# Patient Record
Sex: Female | Born: 1995 | Race: White | Hispanic: No | Marital: Single | Smoking: Never smoker
Health system: Southern US, Community
[De-identification: ages and names within clinical notes are randomized; demographics above are authoritative.]

## PROBLEM LIST (undated history)

## (undated) ENCOUNTER — Inpatient Hospital Stay (HOSPITAL_COMMUNITY): Payer: Self-pay

## (undated) ENCOUNTER — Emergency Department (HOSPITAL_COMMUNITY): Admission: EM | Payer: Self-pay

## (undated) DIAGNOSIS — S83006A Unspecified dislocation of unspecified patella, initial encounter: Secondary | ICD-10-CM

## (undated) DIAGNOSIS — K589 Irritable bowel syndrome without diarrhea: Secondary | ICD-10-CM

## (undated) DIAGNOSIS — D649 Anemia, unspecified: Secondary | ICD-10-CM

## (undated) DIAGNOSIS — E039 Hypothyroidism, unspecified: Secondary | ICD-10-CM

## (undated) DIAGNOSIS — J45909 Unspecified asthma, uncomplicated: Secondary | ICD-10-CM

## (undated) HISTORY — PX: KNEE SURGERY: SHX244

## (undated) HISTORY — PX: TONSILLECTOMY: SUR1361

---
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
## 2015-01-14 ENCOUNTER — Emergency Department (HOSPITAL_BASED_OUTPATIENT_CLINIC_OR_DEPARTMENT_OTHER)
Admission: EM | Admit: 2015-01-14 | Discharge: 2015-01-14 | Disposition: A | Discharge status: Home / Self Care | Payer: Medicaid Other | Attending: Emergency Medicine | Admitting: Emergency Medicine

## 2015-01-14 ENCOUNTER — Encounter (HOSPITAL_BASED_OUTPATIENT_CLINIC_OR_DEPARTMENT_OTHER): Payer: Self-pay

## 2015-01-14 ENCOUNTER — Emergency Department (HOSPITAL_BASED_OUTPATIENT_CLINIC_OR_DEPARTMENT_OTHER): Payer: Medicaid Other

## 2015-01-14 DIAGNOSIS — M940 Chondrocostal junction syndrome [Tietze]: Secondary | ICD-10-CM | POA: Insufficient documentation

## 2015-01-14 DIAGNOSIS — Z79899 Other long term (current) drug therapy: Secondary | ICD-10-CM | POA: Diagnosis not present

## 2015-01-14 DIAGNOSIS — Z8639 Personal history of other endocrine, nutritional and metabolic disease: Secondary | ICD-10-CM | POA: Insufficient documentation

## 2015-01-14 DIAGNOSIS — Z8719 Personal history of other diseases of the digestive system: Secondary | ICD-10-CM | POA: Insufficient documentation

## 2015-01-14 DIAGNOSIS — R079 Chest pain, unspecified: Secondary | ICD-10-CM | POA: Diagnosis present

## 2015-01-14 DIAGNOSIS — J45909 Unspecified asthma, uncomplicated: Secondary | ICD-10-CM | POA: Insufficient documentation

## 2015-01-14 DIAGNOSIS — R0789 Other chest pain: Secondary | ICD-10-CM | POA: Insufficient documentation

## 2015-01-14 DIAGNOSIS — Z862 Personal history of diseases of the blood and blood-forming organs and certain disorders involving the immune mechanism: Secondary | ICD-10-CM | POA: Insufficient documentation

## 2015-01-14 DIAGNOSIS — Z7951 Long term (current) use of inhaled steroids: Secondary | ICD-10-CM | POA: Insufficient documentation

## 2015-01-14 HISTORY — DX: Hypothyroidism, unspecified: E03.9

## 2015-01-14 MED ORDER — NAPROXEN 500 MG PO TABS: 500.0000 mg | ORAL_TABLET | Freq: Two times a day (BID) | ORAL | Status: DC

## 2015-01-14 MED ORDER — KETOROLAC TROMETHAMINE 60 MG/2ML IM SOLN
60.0000 mg | Freq: Once | INTRAMUSCULAR | Status: AC
  Administered 2015-01-14: 60 mg via INTRAMUSCULAR
  Filled 2015-01-14: qty 2

## 2015-01-14 NOTE — ED Notes (Signed)
C/o CP, left arm pain started approx 30 min PTA

## 2015-01-14 NOTE — Discharge Instructions (Signed)
Take naproxen as prescribed as needed for pain.  Chest Wall Pain Chest wall pain is pain in or around the bones and muscles of your chest. It may take up to 6 weeks to get better. It may take longer if you must stay physically active in your work and activities.  CAUSES  Chest wall pain may happen on its own. However, it may be caused by:  A viral illness like the flu.  Injury.  Coughing.  Exercise.  Arthritis.  Fibromyalgia.  Shingles. HOME CARE INSTRUCTIONS   Avoid overtiring physical activity. Try not to strain or perform activities that cause pain. This includes any activities using your chest or your abdominal and side muscles, especially if heavy weights are used.  Put ice on the sore area.  Put ice in a plastic bag.  Place a towel between your skin and the bag.  Leave the ice on for 15-20 minutes per hour while awake for the first 2 days.  Only take over-the-counter or prescription medicines for pain, discomfort, or fever as directed by your caregiver. SEEK IMMEDIATE MEDICAL CARE IF:   Your pain increases, or you are very uncomfortable.  You have a fever.  Your chest pain becomes worse.  You have new, unexplained symptoms.  You have nausea or vomiting.  You feel sweaty or lightheaded.  You have a cough with phlegm (sputum), or you cough up blood. MAKE SURE YOU:   Understand these instructions.  Will watch your condition.  Will get help right away if you are not doing well or get worse. Document Released: 07/06/2005 Document Revised: 09/28/2011 Document Reviewed: 03/02/2011 [facility] Patient Information 2015 [place], [place]. This information is not intended to replace advice given to you by your health care provider. Make sure you discuss any questions you have with your health care provider.  Costochondritis Costochondritis, sometimes called Tietze syndrome, is a swelling and irritation (inflammation) of the tissue (cartilage) that connects your ribs  with your breastbone (sternum). It causes pain in the chest and rib area. Costochondritis usually goes away on its own over time. It can take up to 6 weeks or longer to get better, especially if you are unable to limit your activities. CAUSES  Some cases of costochondritis have no known cause. Possible causes include:  Injury (trauma).  Exercise or activity such as lifting.  Severe coughing. SIGNS AND SYMPTOMS  Pain and tenderness in the chest and rib area.  Pain that gets worse when coughing or taking deep breaths.  Pain that gets worse with specific movements. DIAGNOSIS  Your health care provider will do a physical exam and ask about your symptoms. Chest X-rays or other tests may be done to rule out other problems. TREATMENT  Costochondritis usually goes away on its own over time. Your health care provider may prescribe medicine to help relieve pain. HOME CARE INSTRUCTIONS   Avoid exhausting physical activity. Try not to strain your ribs during normal activity. This would include any activities using chest, abdominal, and side muscles, especially if heavy weights are used.  Apply ice to the affected area for the first 2 days after the pain begins.  Put ice in a plastic bag.  Place a towel between your skin and the bag.  Leave the ice on for 20 minutes, 2-3 times a day.  Only take over-the-counter or prescription medicines as directed by your health care provider. SEEK MEDICAL CARE IF:  You have redness or swelling at the rib joints. These are signs of infection.  Your pain does not go away despite rest or medicine. SEEK IMMEDIATE MEDICAL CARE IF:   Your pain increases or you are very uncomfortable.  You have shortness of breath or difficulty breathing.  You cough up blood.  You have worse chest pains, sweating, or vomiting.  You have a fever or persistent symptoms for more than 2-3 days.  You have a fever and your symptoms suddenly get worse. MAKE SURE YOU:    Understand these instructions.  Will watch your condition.  Will get help right away if you are not doing well or get worse. Document Released: 04/15/2005 Document Revised: 04/26/2013 Document Reviewed: 02/07/2013 [facility] Patient Information 2015 [place], [place]. This information is not intended to replace advice given to you by your health care provider. Make sure you discuss any questions you have with your health care provider.

## 2015-01-14 NOTE — ED Provider Notes (Signed)
CSN: [id number]     Arrival date & time 01/14/15  2115 History   First MD Initiated Contact with Patient 01/14/15 2128     Chief Complaint  Patient presents with  . Chest Pain     (Consider location/radiation/quality/duration/timing/severity/associated sxs/prior Treatment) HPI Comments: 19 year old female presenting with sudden onset midsternal chest pain beginning 30 minutes prior to arrival while in the car. States she was feeling fine prior to the onset of pain. Pain is described as a constant, dull ache that is also sharp, slightly radiating into the left side of her chest, worse with certain movements. Endorses pain down her left arm, however states it is not radiating from her chest. Denies numbness or tingling. Denies shortness of breath, cough, fever, chills, nausea, vomiting or diaphoresis. Denies ever having pain like this in the past. No injury or trauma. No family history of sudden cardiac death. Positive family history of early heart disease in a paternal grandfather. No personal or family history of blood clots. She is on a birth control patch but unsure of the name. Recently traveled in a car for a few hours to the beach, however no other recent immobilizations. No calf pain or swelling. Denies being under any stress/anxiety.  Patient is a 19 y.o. female presenting with chest pain. The history is provided by the patient and a parent.  Chest Pain Chest pain location: mid-sternal/left. Pain quality: dull and sharp   Pain radiates to the back: no   Pain severity:  Moderate Onset quality:  Sudden Duration:  30 minutes Timing:  Constant Progression:  Unchanged Chronicity:  New Context: at rest   Relieved by:  None tried Worsened by:  Nothing tried Ineffective treatments:  None tried Risk factors: birth control and obesity     Past Medical History  Diagnosis Date  . Asthma   . Anemia   . IBS (irritable bowel syndrome)   . Hypothyroid    Past Surgical History  Procedure  Laterality Date  . Knee surgery    . Video bronchoscopy Bilateral 06/29/2013    Procedure: VIDEO BRONCHOSCOPY WITHOUT FLUORO;  Surgeon: Brand Males, MD;  Location: [facility];  Service: Cardiopulmonary;  Laterality: Bilateral;  . Tonsillectomy     Family History  Problem Relation Age of Onset  . Asthma Paternal Grandfather   . Asthma Paternal Grandmother   . Heart disease Maternal Grandfather   . Heart disease Paternal Grandfather   . Heart disease Paternal Uncle   . Clotting disorder Paternal Grandmother   . Rheum arthritis Paternal Grandmother   . Cancer Paternal Grandfather     and several uncles.    History  Substance Use Topics  . Smoking status: Never Smoker   . Smokeless tobacco: Not on file  . Alcohol Use: No   OB History    No data available     Review of Systems  Cardiovascular: Positive for chest pain.  All other systems reviewed and are negative.     Allergies  Azithromycin; Augmentin; Codeine; and Sulfa antibiotics  Home Medications   Prior to Admission medications   Medication Sig Start Date End Date Taking? Authorizing Provider  AMITRIPTYLINE HCL PO Take by mouth.   Yes Historical Provider, MD  albuterol (PROAIR HFA) 108 (90 BASE) MCG/ACT inhaler Inhale 2 puffs into the lungs every 4 (four) hours as needed for wheezing or shortness of breath. 11/19/14   Midge Minium, MD  beclomethasone (QVAR) 40 MCG/ACT inhaler Inhale 1 puff into the lungs 2 (two)  times daily. 11/19/14   Midge Minium, MD  montelukast (SINGULAIR) 10 MG tablet TAKE ONE TABLET BY MOUTH AT BEDTIME 10/08/14   Midge Minium, MD  naproxen (NAPROSYN) 500 MG tablet Take 1 tablet (500 mg total) by mouth 2 (two) times daily. 01/14/15   Ozzy Bohlken M Analise Glotfelty, PA-C   BP 113/67 mmHg  Pulse 79  Temp(Src) 98.1 F (36.7 C) (Oral)  Resp 12  Ht 5\' 4"  (1.626 m)  Wt 242 lb (109.77 kg)  BMI 41.52 kg/m2  SpO2 100%  LMP 01/07/2015 Physical Exam  Constitutional: She is oriented to person,  place, and time. She appears well-developed and well-nourished. No distress.  HENT:  Head: Normocephalic and atraumatic.  Mouth/Throat: Oropharynx is clear and moist.  Eyes: Conjunctivae and EOM are normal. Pupils are equal, round, and reactive to light.  Neck: Normal range of motion. Neck supple. No JVD present.  Cardiovascular: Normal rate, regular rhythm, normal heart sounds and intact distal pulses.   No extremity edema.  Pulmonary/Chest: Effort normal and breath sounds normal. No respiratory distress. She exhibits tenderness.    Abdominal: Soft. Bowel sounds are normal. There is no tenderness.  Musculoskeletal: Normal range of motion. She exhibits no edema.  Neurological: She is alert and oriented to person, place, and time. She has normal strength. No sensory deficit.  Speech fluent, goal oriented. Moves extremities without ataxia. Equal grip strength bilateral.  Skin: Skin is warm and dry. She is not diaphoretic.  Psychiatric: She has a normal mood and affect. Her behavior is normal.  Nursing note and vitals reviewed.   ED Course  Procedures (including critical care time) Labs Review Labs Reviewed - No data to display  Imaging Review Dg Chest 2 View  01/14/2015   CLINICAL DATA:  Left-sided chest pain and difficulty breathing.  EXAM: CHEST - 2 VIEW  COMPARISON:  06/09/2013  FINDINGS: The heart size and mediastinal contours are within normal limits. There is no evidence of pulmonary edema, consolidation, pneumothorax, nodule or pleural fluid. The visualized skeletal structures are unremarkable.  IMPRESSION: No active disease.   Electronically Signed   By: Aletta Edouard M.D.   On: 01/14/2015 22:02     EKG Interpretation   Date/Time:  Monday January 14 2015 21:32:41 EDT Ventricular Rate:  85 PR Interval:  176 QRS Duration: 92 QT Interval:  368 QTC Calculation: 437 R Axis:   74 Text Interpretation:  Normal sinus rhythm Normal ECG No previous ECGs  available Confirmed by  Christy Gentles  MD, DONALD ([id number]) on 01/14/2015 9:35:10  PM      MDM   Final diagnoses:  Chest wall pain  Costochondritis   Non-toxic appearing, NAD. Afebrile. VSS. Alert and appropriate for age.  CP is reproducible. Doubt cardiac. Low risk. Doubt PE, low suspicion. CXR negative. CP improved with toradol. Normal EKG. I do not feel further workup necessary at this time. Pt stable for d/c. Return precautions given. Pt and parent state understanding of plan and are agreeable.  Carman Ching, PA-C 01/14/15 2245  Carman Ching, PA-C 01/14/15 7026  Ripley Fraise, MD 01/14/15 [phone]

## 2015-01-15 ENCOUNTER — Other Ambulatory Visit: Payer: Self-pay | Admitting: Family Medicine

## 2015-01-16 ENCOUNTER — Encounter: Payer: Self-pay | Admitting: Family Medicine

## 2015-01-16 ENCOUNTER — Telehealth: Payer: Self-pay | Admitting: *Deleted

## 2015-01-16 ENCOUNTER — Ambulatory Visit (INDEPENDENT_AMBULATORY_CARE_PROVIDER_SITE_OTHER): Payer: Self-pay | Admitting: Family Medicine

## 2015-01-16 VITALS — BP 122/82 | HR 94 | Temp 98.0°F | Resp 16 | Wt 242.0 lb

## 2015-01-16 DIAGNOSIS — E039 Hypothyroidism, unspecified: Secondary | ICD-10-CM

## 2015-01-16 LAB — BASIC METABOLIC PANEL
BUN: 11 mg/dL (ref 6–23)
CHLORIDE: 108 meq/L (ref 96–112)
CO2: 26 meq/L (ref 19–32)
CREATININE: 0.68 mg/dL (ref 0.40–1.20)
Calcium: 9.2 mg/dL (ref 8.4–10.5)
GFR: 118.92 mL/min (ref >60.00)
Glucose, Bld: 87 mg/dL (ref 70–99)
Potassium: 4.4 mEq/L (ref 3.5–5.1)
Sodium: 143 mEq/L (ref 135–145)

## 2015-01-16 LAB — HEMOGLOBIN A1C: HEMOGLOBIN A1C: 5.6 % (ref 4.6–6.5)

## 2015-01-16 LAB — TSH: TSH: 2.96 u[IU]/mL (ref 0.40–5.00)

## 2015-01-16 NOTE — Telephone Encounter (Signed)
Patient scheduled for new patient appointment with Dr. Birdie Riddle today.  Patient was recently seen in ED 01/14/15 for chest wall pain/ costochondritis.  Attempted to call patient to follow-up and triage.  Patient was unavailable at time of call and voicemail was full.

## 2015-01-16 NOTE — Patient Instructions (Signed)
Once we have your lab results we'll determine when you need to follow up Try and make healthy food choices and get regular exercise Call with any questions or concerns Hang in there!!!

## 2015-01-16 NOTE — Progress Notes (Signed)
   Subjective:    Patient ID: Jessica Caldwell, female    DOB: 11/30/1995, 19 y.o.   MRN: [id number]  HPI Hypothyroid- pt was living w/ her father and seeing another doctor.  Was previously on thyroid med and was told 'it's working so we'll take her off it'.  Now having fatigue, weight gain.  Has been off meds since last year.  Pt reports feeling tired 'the majority of the time'.  Denies constipation, dry skin/hair/nails.  Pt's weight has gone from 183 lbs 14 months ago to 262.  Denies increased urination.  + thirst.   Review of Systems For ROS see HPI     Objective:   Physical Exam  Constitutional: She is oriented to person, place, and time. She appears well-developed and well-nourished. No distress.  obese  HENT:  Head: Normocephalic and atraumatic.  Eyes: Conjunctivae and EOM are normal. Pupils are equal, round, and reactive to light.  Neck: Normal range of motion. Neck supple. Thyromegaly (w/o nodularity) present.  Cardiovascular: Normal rate, regular rhythm, normal heart sounds and intact distal pulses.   No murmur heard. Pulmonary/Chest: Effort normal and breath sounds normal. No respiratory distress.  Abdominal: Soft. She exhibits no distension. There is no tenderness.  Musculoskeletal: She exhibits no edema.  Lymphadenopathy:    She has no cervical adenopathy.  Neurological: She is alert and oriented to person, place, and time.  Skin: Skin is warm and dry.  Psychiatric: She has a normal mood and affect. Her behavior is normal.  Vitals reviewed.         Assessment & Plan:

## 2015-01-16 NOTE — Telephone Encounter (Signed)
Med filled.  

## 2015-01-16 NOTE — Assessment & Plan Note (Addendum)
Chronic problem but pt has been off all meds since last year.  Pt has had weight gain and fatigue.  + thyromegaly but pt doesn't currently have insurance.  This will need [place] evaluation but mom is in process of transferring pt's Medicaid.  Check labs and restart thyroid meds at appropriate dose.

## 2015-01-16 NOTE — Assessment & Plan Note (Signed)
Deteriorated.  Pt has had 80 lb weight gain in last year.  Pt reports she was up as high as 280 but recently dropped 18 lbs rapidly and w/o changes to diet or exercise.  This, coupled w/ her increased thirst and fatigue, is concerning for diabetes.  Check labs.  Will follow closely.

## 2015-01-17 ENCOUNTER — Other Ambulatory Visit: Payer: Self-pay | Admitting: General Practice

## 2015-01-17 MED ORDER — LEVOTHYROXINE SODIUM 50 MCG PO TABS: 50.0000 ug | ORAL_TABLET | Freq: Every day | ORAL | Status: DC

## 2015-02-10 ENCOUNTER — Encounter (HOSPITAL_BASED_OUTPATIENT_CLINIC_OR_DEPARTMENT_OTHER): Payer: Self-pay | Admitting: Emergency Medicine

## 2015-02-10 ENCOUNTER — Emergency Department (HOSPITAL_BASED_OUTPATIENT_CLINIC_OR_DEPARTMENT_OTHER)
Admission: EM | Admit: 2015-02-10 | Discharge: 2015-02-10 | Disposition: A | Discharge status: Home / Self Care | Payer: Medicaid Other | Attending: Emergency Medicine | Admitting: Emergency Medicine

## 2015-02-10 ENCOUNTER — Emergency Department (HOSPITAL_BASED_OUTPATIENT_CLINIC_OR_DEPARTMENT_OTHER): Payer: Medicaid Other

## 2015-02-10 DIAGNOSIS — R000 Tachycardia, unspecified: Secondary | ICD-10-CM | POA: Insufficient documentation

## 2015-02-10 DIAGNOSIS — Z8719 Personal history of other diseases of the digestive system: Secondary | ICD-10-CM | POA: Diagnosis not present

## 2015-02-10 DIAGNOSIS — Z79899 Other long term (current) drug therapy: Secondary | ICD-10-CM | POA: Insufficient documentation

## 2015-02-10 DIAGNOSIS — Z791 Long term (current) use of non-steroidal anti-inflammatories (NSAID): Secondary | ICD-10-CM | POA: Insufficient documentation

## 2015-02-10 DIAGNOSIS — Z880 Allergy status to penicillin: Secondary | ICD-10-CM | POA: Diagnosis not present

## 2015-02-10 DIAGNOSIS — R0602 Shortness of breath: Secondary | ICD-10-CM

## 2015-02-10 DIAGNOSIS — Z3202 Encounter for pregnancy test, result negative: Secondary | ICD-10-CM | POA: Diagnosis not present

## 2015-02-10 DIAGNOSIS — J45901 Unspecified asthma with (acute) exacerbation: Secondary | ICD-10-CM | POA: Insufficient documentation

## 2015-02-10 DIAGNOSIS — Z862 Personal history of diseases of the blood and blood-forming organs and certain disorders involving the immune mechanism: Secondary | ICD-10-CM | POA: Insufficient documentation

## 2015-02-10 DIAGNOSIS — E039 Hypothyroidism, unspecified: Secondary | ICD-10-CM | POA: Diagnosis not present

## 2015-02-10 LAB — CBC WITH DIFFERENTIAL/PLATELET
BASOS ABS: 0 10*3/uL (ref 0.0–0.1)
Basophils Relative: 0 % (ref 0–1)
Eosinophils Absolute: 0.2 10*3/uL (ref 0.0–0.7)
Eosinophils Relative: 3 % (ref 0–5)
HCT: 36.8 % (ref 36.0–46.0)
Hemoglobin: 11.7 g/dL — ABNORMAL LOW (ref 12.0–15.0)
Lymphocytes Relative: 33 % (ref 12–46)
Lymphs Abs: 2.4 10*3/uL (ref 0.7–4.0)
MCH: 27.7 pg (ref 26.0–34.0)
MCHC: 31.8 g/dL (ref 30.0–36.0)
MCV: 87 fL (ref 78.0–100.0)
MONO ABS: 0.5 10*3/uL (ref 0.1–1.0)
MONOS PCT: 7 % (ref 3–12)
NEUTROS PCT: 57 % (ref 43–77)
Neutro Abs: 4.2 10*3/uL (ref 1.7–7.7)
PLATELETS: 248 10*3/uL (ref 150–400)
RBC: 4.23 MIL/uL (ref 3.87–5.11)
RDW: 13.4 % (ref 11.5–15.5)
WBC: 7.3 10*3/uL (ref 4.0–10.5)

## 2015-02-10 LAB — URINALYSIS, ROUTINE W REFLEX MICROSCOPIC
BILIRUBIN URINE: NEGATIVE
GLUCOSE, UA: NEGATIVE mg/dL
Hgb urine dipstick: NEGATIVE
Ketones, ur: NEGATIVE mg/dL
LEUKOCYTES UA: NEGATIVE
NITRITE: NEGATIVE
PH: 7.5 (ref 5.0–8.0)
Protein, ur: NEGATIVE mg/dL
SPECIFIC GRAVITY, URINE: 1.007 (ref 1.005–1.030)
Urobilinogen, UA: 0.2 mg/dL (ref 0.0–1.0)

## 2015-02-10 LAB — BASIC METABOLIC PANEL
Anion gap: 7 (ref 5–15)
BUN: 8 mg/dL (ref 6–20)
CALCIUM: 8.8 mg/dL — AB (ref 8.9–10.3)
CO2: 24 mmol/L (ref 22–32)
CREATININE: 0.71 mg/dL (ref 0.44–1.00)
Chloride: 108 mmol/L (ref 101–111)
GFR calc non Af Amer: >60 mL/min (ref >60)
Glucose, Bld: 89 mg/dL (ref 65–99)
Potassium: 4.3 mmol/L (ref 3.5–5.1)
Sodium: 139 mmol/L (ref 135–145)

## 2015-02-10 LAB — D-DIMER, QUANTITATIVE: D-Dimer, Quant: <0.27 ug/mL-FEU (ref 0.00–0.48)

## 2015-02-10 LAB — PREGNANCY, URINE: PREG TEST UR: NEGATIVE

## 2015-02-10 NOTE — Discharge Instructions (Signed)
Please follow with your primary care doctor in the next 2 days for a check-up. They must obtain records for further management.   Do not hesitate to return to the Emergency Department for any new, worsening or concerning symptoms.    Shortness of Breath Shortness of breath means you have trouble breathing. It could also mean that you have a medical problem. You should get immediate medical care for shortness of breath. CAUSES   Not enough oxygen in the air such as with high altitudes or a smoke-filled room.  Certain lung diseases, infections, or problems.  Heart disease or conditions, such as angina or heart failure.  Low red blood cells (anemia).  Poor physical fitness, which can cause shortness of breath when you exercise.  Chest or back injuries or stiffness.  Being overweight.  Smoking.  Anxiety, which can make you feel like you are not getting enough air. DIAGNOSIS  Serious medical problems can often be found during your physical exam. Tests may also be done to determine why you are having shortness of breath. Tests may include:  Chest X-rays.  Lung function tests.  Blood tests.  An electrocardiogram (ECG).  An ambulatory electrocardiogram. An ambulatory ECG records your heartbeat patterns over a 24-hour period.  Exercise testing.  A transthoracic echocardiogram (TTE). During echocardiography, sound waves are used to evaluate how blood flows through your heart.  A transesophageal echocardiogram (TEE).  Imaging scans. Your health care provider may not be able to find a cause for your shortness of breath after your exam. In this case, it is important to have a follow-up exam with your health care provider as directed.  TREATMENT  Treatment for shortness of breath depends on the cause of your symptoms and can vary greatly. HOME CARE INSTRUCTIONS   Do not smoke. Smoking is a common cause of shortness of breath. If you smoke, ask for help to quit.  Avoid being  around chemicals or things that may bother your breathing, such as paint fumes and dust.  Rest as needed. Slowly resume your usual activities.  If medicines were prescribed, take them as directed for the full length of time directed. This includes oxygen and any inhaled medicines.  Keep all follow-up appointments as directed by your health care provider. SEEK MEDICAL CARE IF:   Your condition does not improve in the time expected.  You have a hard time doing your normal activities even with rest.  You have any new symptoms. SEEK IMMEDIATE MEDICAL CARE IF:   Your shortness of breath gets worse.  You feel light-headed, faint, or develop a cough not controlled with medicines.  You start coughing up blood.  You have pain with breathing.  You have chest pain or pain in your arms, shoulders, or abdomen.  You have a fever.  You are unable to walk up stairs or exercise the way you normally do. MAKE SURE YOU:  Understand these instructions.  Will watch your condition.  Will get help right away if you are not doing well or get worse. Document Released: 03/31/2001 Document Revised: 07/11/2013 Document Reviewed: 09/21/2011 [facility] Patient Information 2015 [place], [place]. This information is not intended to replace advice given to you by your health care provider. Make sure you discuss any questions you have with your health care provider.

## 2015-02-10 NOTE — ED Notes (Signed)
Patient is having SOB and patient reports that her chest hurts

## 2015-02-10 NOTE — ED Provider Notes (Signed)
CSN: [id number]     Arrival date & time 02/10/15  2008 History   This chart was scribed for Alfonzo Beers, MD by Chester Holstein, ED Scribe. This patient was seen in room MH04/MH04 and the patient's care was started at 9:51 PM.      Chief Complaint  Patient presents with  . Shortness of Breath     The history is provided by the patient. No language interpreter was used.   HPI Comments: Jessica Caldwell is a 19 y.o. female with PMHx of hypothyroidism and asthma who presents to the Emergency Department complaining of SOB with acute onset today. She notes associated fatigue and central chest pressure. Mother states pt has reported general malaise in last 2 days. Pt denies pain with deep inspiration but states it is hard to do so. Pt was last seen in ED on 01/14/15 for reproducible midsternal chest pain with acute onset. Workup showed no significant findings. Chest pain was improved with Toradal. Pt is on a birth control patch. She denies recent prolonged trips.  She denies calf tenderness, cough, fever, and chills.  Past Medical History  Diagnosis Date  . Asthma   . Anemia   . IBS (irritable bowel syndrome)   . Hypothyroid    Past Surgical History  Procedure Laterality Date  . Knee surgery    . Video bronchoscopy Bilateral 06/29/2013    Procedure: VIDEO BRONCHOSCOPY WITHOUT FLUORO;  Surgeon: Brand Males, MD;  Location: [facility];  Service: Cardiopulmonary;  Laterality: Bilateral;  . Tonsillectomy     Family History  Problem Relation Age of Onset  . Asthma Paternal Grandfather   . Asthma Paternal Grandmother   . Heart disease Maternal Grandfather   . Heart disease Paternal Grandfather   . Heart disease Paternal Uncle   . Clotting disorder Paternal Grandmother   . Rheum arthritis Paternal Grandmother   . Cancer Paternal Grandfather     and several uncles.    History  Substance Use Topics  . Smoking status: Never Smoker   . Smokeless tobacco: Not on file  . Alcohol Use: No    OB History    No data available     Review of Systems  Respiratory: Positive for shortness of breath.   A complete 10 system review of systems was obtained and all systems are negative except as noted in the HPI and PMH.      Allergies  Azithromycin; Augmentin; Oxycodone-acetaminophen; Penicillins; Codeine; and Sulfa antibiotics  Home Medications   Prior to Admission medications   Medication Sig Start Date End Date Taking? Authorizing Provider  AMITRIPTYLINE HCL PO Take by mouth.    Historical Provider, MD  beclomethasone (QVAR) 40 MCG/ACT inhaler Inhale 2 puffs into the lungs.    Historical Provider, MD  levothyroxine (SYNTHROID, LEVOTHROID) 50 MCG tablet Take 1 tablet (50 mcg total) by mouth daily. 01/17/15   Midge Minium, MD  montelukast (SINGULAIR) 10 MG tablet TAKE ONE TABLET BY MOUTH AT BEDTIME 10/08/14   Midge Minium, MD  naproxen (NAPROSYN) 500 MG tablet Take 1 tablet (500 mg total) by mouth 2 (two) times daily. 01/14/15   Carman Ching, PA-C  norelgestromin-ethinyl estradiol (ORTHO EVRA) 150-35 MCG/24HR transdermal patch Place 1 patch onto the skin. 10/08/14   Historical Provider, MD  pantoprazole (PROTONIX) 20 MG tablet Take 20 mg by mouth. 12/29/11   Historical Provider, MD  PROAIR HFA 108 (90 BASE) MCG/ACT inhaler INHALE TWO PUFFS BY MOUTH EVERY 4 HOURS AS NEEDED FOR  WHEEZING OR FOR SHORTNESS OF BREATH 01/16/15   Midge Minium, MD   BP 126/84 mmHg  Pulse 105  Temp(Src) 98.5 F (36.9 C) (Oral)  Resp 20  Ht 5\' 4"  (1.626 m)  Wt 242 lb (109.77 kg)  BMI 41.52 kg/m2  SpO2 100%  LMP 12/31/2014 Physical Exam  Constitutional: She is oriented to person, place, and time. She appears well-developed and well-nourished. No distress.  HENT:  Head: Normocephalic.  Mouth/Throat: Oropharynx is clear and moist.  Eyes: Conjunctivae are normal.  Neck: Normal range of motion. No JVD present. No tracheal deviation present.  Cardiovascular: Normal rate, regular rhythm and  intact distal pulses.   Radial pulse equal bilaterally  Pulmonary/Chest: Effort normal and breath sounds normal. No stridor. No respiratory distress. She has no wheezes. She has no rales. She exhibits no tenderness.  Abdominal: Soft. She exhibits no distension and no mass. There is no tenderness. There is no rebound and no guarding.  Musculoskeletal: Normal range of motion. She exhibits no edema or tenderness.  No calf asymmetry, superficial collaterals, palpable cords, edema, Homans sign negative bilaterally.    Neurological: She is alert and oriented to person, place, and time.  Skin: Skin is warm. She is not diaphoretic.  Psychiatric: She has a normal mood and affect.  Nursing note and vitals reviewed.   ED Course  Procedures (including critical care time) DIAGNOSTIC STUDIES: Oxygen Saturation is 100% on room air, normal by my interpretation.    COORDINATION OF CARE: 9:56 PM Discussed treatment plan with patient at beside, the patient agrees with the plan and has no further questions at this time.   Labs Review Labs Reviewed  URINALYSIS, ROUTINE W REFLEX MICROSCOPIC (NOT AT [facility])  PREGNANCY, URINE  D-DIMER, QUANTITATIVE (NOT AT [facility])  CBC WITH DIFFERENTIAL/PLATELET  BASIC METABOLIC PANEL    Imaging Review Dg Chest 2 View  02/10/2015   CLINICAL DATA:  Chest pressure for 1 day  EXAM: CHEST  2 VIEW  COMPARISON:  January 14, 2015  FINDINGS: Lungs are clear. Heart size and pulmonary vascularity are normal. No pneumothorax. No adenopathy. No bone lesions.  IMPRESSION: No abnormality noted.   Electronically Signed   By: Lowella Grip III M.D.   On: 02/10/2015 21:51     EKG Interpretation None      MDM   Final diagnoses:  SOB (shortness of breath)    Filed Vitals:   02/10/15 2014 02/10/15 2252  BP: 126/84 99/76  Pulse: 105 93  Temp: 98.5 F (36.9 C)   TempSrc: Oral   Resp: 20 16  Height: 5\' 4"  (1.626 m)   Weight: 242 lb (109.77 kg)   SpO2: 100% 100%    Jessica Caldwell is a pleasant 19 y.o. female presenting with shortness of breath and chest pressure onset today. Patient is taking birth control in the form of a patch, she is initially tachycardic. She will need workup for PE. Her EKG is with no arrhythmia or signs of ischemia. She has no family history of cardiac death. Her chest x-rays negative, blood work including dimer is normal. Patient reassured, she does have a history of asthma however she has excellent air movement with no wheezing, don't think that this is asthma related. Will follow closely with her PCP in the next week.  Evaluation does not show pathology that would require ongoing emergent intervention or inpatient treatment. Pt is hemodynamically stable and mentating appropriately. Discussed findings and plan with patient/guardian, who agrees with care plan.  All questions answered. Return precautions discussed and outpatient follow up given.   I personally performed the services described in this documentation, which was scribed in my presence. The recorded information has been reviewed and is accurate.     Monico Blitz, PA-C 02/11/15 [id number]  Alfonzo Beers, MD 02/12/15 [phone]

## 2015-02-10 NOTE — ED Notes (Signed)
Clear bilaterial breath sounds, moving good air at this time

## 2015-02-21 ENCOUNTER — Encounter: Payer: Self-pay | Admitting: Medical

## 2015-02-21 ENCOUNTER — Ambulatory Visit (INDEPENDENT_AMBULATORY_CARE_PROVIDER_SITE_OTHER): Payer: Self-pay | Admitting: Medical

## 2015-02-21 VITALS — BP 113/60 | HR 73 | Temp 98.7°F | Ht 65.0 in | Wt 240.4 lb

## 2015-02-21 DIAGNOSIS — M25561 Pain in right knee: Secondary | ICD-10-CM

## 2015-02-21 DIAGNOSIS — R5383 Other fatigue: Secondary | ICD-10-CM

## 2015-02-21 DIAGNOSIS — E010 Iodine-deficiency related diffuse (endemic) goiter: Secondary | ICD-10-CM

## 2015-02-21 DIAGNOSIS — E049 Nontoxic goiter, unspecified: Secondary | ICD-10-CM

## 2015-02-21 DIAGNOSIS — M79609 Pain in unspecified limb: Secondary | ICD-10-CM

## 2015-02-21 DIAGNOSIS — E038 Other specified hypothyroidism: Secondary | ICD-10-CM

## 2015-02-21 DIAGNOSIS — M79661 Pain in right lower leg: Secondary | ICD-10-CM
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
## 2015-02-21 NOTE — Patient Instructions (Addendum)
1 wk ago cbc and cmp looked ok by chart review. Will repeat tsh and add t3/t4. Go ahead and get thyroid US.  Rt xray of rt knee, rt tibia and get rt lower ext doppler.  Ibuprofen for pain. We will call you on results of test.  Follow up in 7 days or as needed  Pt can get some both xay lower ext [place] now. But may be delayed since Mom may have go to job. But she states they can come back tonight.

## 2015-02-21 NOTE — Addendum Note (Signed)
Addended by: Tasia Catchings on: 02/21/2015 06:01 PM   Modules accepted: Orders

## 2015-02-21 NOTE — Progress Notes (Signed)
Pre visit review using our clinic review tool, if applicable. No additional management support is needed unless otherwise documented below in the visit note. 

## 2015-02-22 ENCOUNTER — Ambulatory Visit (HOSPITAL_BASED_OUTPATIENT_CLINIC_OR_DEPARTMENT_OTHER)
Admission: RE | Admit: 2015-02-22 | Discharge: 2015-02-22 | Disposition: A | Discharge status: Home / Self Care | Payer: Medicaid Other | Source: Ambulatory Visit | Attending: Medical | Admitting: Medical

## 2015-02-22 ENCOUNTER — Other Ambulatory Visit: Payer: Medicaid Other

## 2015-02-22 ENCOUNTER — Telehealth: Payer: Self-pay | Admitting: Medical

## 2015-02-22 ENCOUNTER — Ambulatory Visit (HOSPITAL_BASED_OUTPATIENT_CLINIC_OR_DEPARTMENT_OTHER): Admission: RE | Admit: 2015-02-22 | Payer: Medicaid Other | Source: Ambulatory Visit

## 2015-02-22 ENCOUNTER — Other Ambulatory Visit (HOSPITAL_BASED_OUTPATIENT_CLINIC_OR_DEPARTMENT_OTHER): Payer: Medicaid Other

## 2015-02-22 DIAGNOSIS — E041 Nontoxic single thyroid nodule: Secondary | ICD-10-CM

## 2015-02-22 DIAGNOSIS — E010 Iodine-deficiency related diffuse (endemic) goiter: Secondary | ICD-10-CM

## 2015-02-22 DIAGNOSIS — R591 Generalized enlarged lymph nodes: Secondary | ICD-10-CM

## 2015-02-22 DIAGNOSIS — S8011XA Contusion of right lower leg, initial encounter: Secondary | ICD-10-CM | POA: Insufficient documentation

## 2015-02-22 DIAGNOSIS — E038 Other specified hypothyroidism: Secondary | ICD-10-CM

## 2015-02-22 DIAGNOSIS — M79661 Pain in right lower leg: Secondary | ICD-10-CM

## 2015-02-22 DIAGNOSIS — M25561 Pain in right knee: Secondary | ICD-10-CM | POA: Insufficient documentation

## 2015-02-22 DIAGNOSIS — M79604 Pain in right leg: Secondary | ICD-10-CM | POA: Diagnosis not present

## 2015-02-22 DIAGNOSIS — M79609 Pain in unspecified limb: Secondary | ICD-10-CM

## 2015-02-22 NOTE — Addendum Note (Signed)
Addended by: Tasia Catchings on: 02/22/2015 02:42 PM   Modules accepted: Orders

## 2015-02-22 NOTE — Addendum Note (Signed)
Addended by: Peggyann Shoals on: 02/22/2015 02:58 PM   Modules accepted: Orders

## 2015-02-22 NOTE — Telephone Encounter (Signed)
[facility] receptionist  informed pt mom on negative xray results. And neg lower ext [place]. I will get lpn to notify on thryoid [place] results.

## 2015-02-23 LAB — T3, FREE: T3 FREE: 2.9 pg/mL (ref 2.3–4.2)

## 2015-02-23 LAB — TSH: TSH: 3.124 u[IU]/mL (ref 0.350–4.500)

## 2015-02-23 LAB — T4, FREE: Free T4: 1.13 ng/dL (ref 0.80–1.80)

## 2015-03-05 NOTE — Telephone Encounter (Signed)
Referral to ent placed.

## 2015-03-07 ENCOUNTER — Telehealth: Payer: Self-pay | Admitting: Family Medicine

## 2015-03-07 NOTE — Telephone Encounter (Signed)
ASE NOTE: All timestamps contained within this report are represented as [place] Standard Time. CONFIDENTIALTY NOTICE: This fax transmission is intended only for the addressee. It contains information that is legally privileged, confidential or otherwise protected from use or disclosure. If you are not the intended recipient, you are strictly prohibited from reviewing, disclosing, copying using or disseminating any of this information or taking any action in reliance on or regarding this information. If you have received this fax in error, please notify us immediately by telephone so that we can arrange for its return to [place]. Phone: [phone], Toll-Free: [phone], Fax: [phone] Page: 1 of 1 Call Id: [id number] [facility] Patient Name: Jessica Caldwell DOB: 06/17/1996 Initial Comment Caller States daughter has cough, stopped up nose, fever. she has asthma, under active thyroid. wants to know what OTC she can give her due to her medical issues. Nurse Assessment Guidelines Guideline Title Affirmed Question Affirmed Notes Final Disposition User Clinical Call Smarr, Therapist, sports, Amy Comments MOM STATES THAT SHE HAS TALKED WITH DR. TABORI AND SHE IS GOING IN TO BE SEEN. WILL CLOSE THIS CALL.

## 2015-03-07 NOTE — Telephone Encounter (Signed)
Pt has an appointment scheduled tomorrow (Friday, 03/08/15) at 9:45 am with Dr. Birdie Riddle.

## 2015-03-07 NOTE — Telephone Encounter (Signed)
Name: Greenhalgh,Tracie Relation to pt: mother   Call back [phone] Pharmacy: [facility] [place], [place] [phone] (Phone) [phone] (Fax)         Reason for call:  Mother states patient is coughing, slight fever and can't take another day off work to bring patient to office unless advised by PCP. Please advise (pt was transferred to [facility] due to slight fever

## 2015-03-07 NOTE — Telephone Encounter (Signed)
Per PCP pt would need an appt for evaluation, medications cannot be called in without being seen. Pt is 18 pt is able to come to appt alone.

## 2015-03-08 ENCOUNTER — Telehealth: Payer: Self-pay | Admitting: Family Medicine

## 2015-03-08 ENCOUNTER — Ambulatory Visit: Payer: Medicaid Other | Admitting: Family Medicine

## 2015-03-08 NOTE — Telephone Encounter (Signed)
Charge please

## 2015-03-08 NOTE — Telephone Encounter (Signed)
Vinciguerra,Tracie (mother) called and stated patient is feeling a little better. Charge or No Charge

## 2015-03-23 ENCOUNTER — Emergency Department (HOSPITAL_BASED_OUTPATIENT_CLINIC_OR_DEPARTMENT_OTHER): Payer: Medicaid Other

## 2015-03-23 ENCOUNTER — Encounter (HOSPITAL_BASED_OUTPATIENT_CLINIC_OR_DEPARTMENT_OTHER): Payer: Self-pay | Admitting: Emergency Medicine

## 2015-03-23 ENCOUNTER — Emergency Department (HOSPITAL_BASED_OUTPATIENT_CLINIC_OR_DEPARTMENT_OTHER)
Admission: EM | Admit: 2015-03-23 | Discharge: 2015-03-23 | Disposition: A | Discharge status: Home / Self Care | Payer: Medicaid Other | Attending: Emergency Medicine | Admitting: Emergency Medicine

## 2015-03-23 DIAGNOSIS — W1839XA Other fall on same level, initial encounter: Secondary | ICD-10-CM | POA: Insufficient documentation

## 2015-03-23 DIAGNOSIS — Y9389 Activity, other specified: Secondary | ICD-10-CM | POA: Diagnosis not present

## 2015-03-23 DIAGNOSIS — Z79899 Other long term (current) drug therapy: Secondary | ICD-10-CM | POA: Insufficient documentation

## 2015-03-23 DIAGNOSIS — Y9289 Other specified places as the place of occurrence of the external cause: Secondary | ICD-10-CM | POA: Insufficient documentation

## 2015-03-23 DIAGNOSIS — M25462 Effusion, left knee: Secondary | ICD-10-CM

## 2015-03-23 DIAGNOSIS — Z7951 Long term (current) use of inhaled steroids: Secondary | ICD-10-CM | POA: Insufficient documentation

## 2015-03-23 DIAGNOSIS — Z8719 Personal history of other diseases of the digestive system: Secondary | ICD-10-CM | POA: Diagnosis not present

## 2015-03-23 DIAGNOSIS — Z862 Personal history of diseases of the blood and blood-forming organs and certain disorders involving the immune mechanism: Secondary | ICD-10-CM | POA: Insufficient documentation

## 2015-03-23 DIAGNOSIS — Z8639 Personal history of other endocrine, nutritional and metabolic disease: Secondary | ICD-10-CM | POA: Diagnosis not present

## 2015-03-23 DIAGNOSIS — S8992XA Unspecified injury of left lower leg, initial encounter: Secondary | ICD-10-CM | POA: Diagnosis not present

## 2015-03-23 DIAGNOSIS — Z880 Allergy status to penicillin: Secondary | ICD-10-CM | POA: Diagnosis not present

## 2015-03-23 DIAGNOSIS — Y998 Other external cause status: Secondary | ICD-10-CM | POA: Diagnosis not present

## 2015-03-23 DIAGNOSIS — J45909 Unspecified asthma, uncomplicated: Secondary | ICD-10-CM | POA: Diagnosis not present

## 2015-03-23 MED ORDER — IBUPROFEN 800 MG PO TABS: 800.0000 mg | ORAL_TABLET | Freq: Three times a day (TID) | ORAL | Status: DC

## 2015-03-23 MED ORDER — IBUPROFEN 800 MG PO TABS
800.0000 mg | ORAL_TABLET | Freq: Once | ORAL | Status: AC
  Administered 2015-03-23: 800 mg via ORAL
  Filled 2015-03-23: qty 1

## 2015-03-23 NOTE — ED Notes (Signed)
Patient reports that she fell yesterday and and hurt left knee. The patient reports that she dislocated her left knee

## 2015-03-23 NOTE — ED Provider Notes (Signed)
CSN: [id number]     Arrival date & time 03/23/15  1920 History  This chart was scribed for Noemi Chapel, MD by Hansel Feinstein, ED Scribe. This patient was seen in room MH06/MH06 and the patient's care was started at 7:32 PM.     Chief Complaint  Patient presents with  . Knee Pain   The history is provided by the patient. No language interpreter was used.    HPI Comments: Jessica Caldwell is a 19 y.o. female who presents to the Emergency Department complaining of moderate left knee pain onset yesterday with associated swelling. Pt states that she stepped in a hole yesterday and saw her knee cap slide off to the side. Pt notes difficulty with walking secondary to pain. She states Hx of knee cap sliding in a similar fashion, surgical repair on the left leg of a torn meniscus. She denies numbness.  Past Medical History  Diagnosis Date  . Asthma   . Anemia   . IBS (irritable bowel syndrome)   . Hypothyroid    Past Surgical History  Procedure Laterality Date  . Knee surgery    . Video bronchoscopy Bilateral 06/29/2013    Procedure: VIDEO BRONCHOSCOPY WITHOUT FLUORO;  Surgeon: Brand Males, MD;  Location: [facility];  Service: Cardiopulmonary;  Laterality: Bilateral;  . Tonsillectomy     Family History  Problem Relation Age of Onset  . Asthma Paternal Grandfather   . Asthma Paternal Grandmother   . Heart disease Maternal Grandfather   . Heart disease Paternal Grandfather   . Heart disease Paternal Uncle   . Clotting disorder Paternal Grandmother   . Rheum arthritis Paternal Grandmother   . Cancer Paternal Grandfather     and several uncles.    Social History  Substance Use Topics  . Smoking status: Never Smoker   . Smokeless tobacco: None  . Alcohol Use: No   OB History    No data available     Review of Systems  Musculoskeletal: Positive for joint swelling and arthralgias.  Neurological: Negative for numbness.      Allergies  Azithromycin; Augmentin;  Oxycodone-acetaminophen; Penicillins; Codeine; and Sulfa antibiotics  Home Medications   Prior to Admission medications   Medication Sig Start Date End Date Taking? Authorizing Provider  AMITRIPTYLINE HCL PO Take by mouth.    Historical Provider, MD  beclomethasone (QVAR) 40 MCG/ACT inhaler Inhale 2 puffs into the lungs.    Historical Provider, MD  ibuprofen (ADVIL,MOTRIN) 800 MG tablet Take 1 tablet (800 mg total) by mouth 3 (three) times daily. 03/23/15   Noemi Chapel, MD  levothyroxine (SYNTHROID, LEVOTHROID) 50 MCG tablet Take 1 tablet (50 mcg total) by mouth daily. 01/17/15   Midge Minium, MD  montelukast (SINGULAIR) 10 MG tablet TAKE ONE TABLET BY MOUTH AT BEDTIME 10/08/14   Midge Minium, MD  norelgestromin-ethinyl estradiol (ORTHO EVRA) 150-35 MCG/24HR transdermal patch Place 1 patch onto the skin. 10/08/14   Historical Provider, MD  pantoprazole (PROTONIX) 20 MG tablet Take 20 mg by mouth. 12/29/11   Historical Provider, MD  PROAIR HFA 108 (90 BASE) MCG/ACT inhaler INHALE TWO PUFFS BY MOUTH EVERY 4 HOURS AS NEEDED FOR WHEEZING OR FOR SHORTNESS OF BREATH 01/16/15   Midge Minium, MD   BP 106/85 mmHg  Pulse 73  Temp(Src) 98.2 F (36.8 C) (Oral)  Resp 16  Ht 5\' 4"  (1.626 m)  Wt 230 lb (104.327 kg)  BMI 39.46 kg/m2  SpO2 100%  LMP 02/20/2015 Physical Exam  Constitutional: She appears well-developed and well-nourished.  HENT:  Head: Normocephalic and atraumatic.  Eyes: Conjunctivae are normal. Right eye exhibits no discharge. Left eye exhibits no discharge.  Pulmonary/Chest: Effort normal. No respiratory distress.  Musculoskeletal: She exhibits tenderness.  SLR intact but has clinical effusion of the left knee. Mild tenderness with ROM of the patella   Neurological: She is alert. Coordination normal.  Skin: Skin is warm and dry. No rash noted. She is not diaphoretic. No erythema.  Psychiatric: She has a normal mood and affect.  Nursing note and vitals reviewed.  ED  Course  Procedures (including critical care time) DIAGNOSTIC STUDIES: Oxygen Saturation is 100% on RA, normal by my interpretation.    COORDINATION OF CARE: 7:37 PM Discussed treatment plan with pt at bedside and pt agreed to plan.   Labs Review Labs Reviewed - No data to display  Imaging Review Dg Knee Complete 4 Views Left  03/23/2015   CLINICAL DATA:  Left knee pain and swelling since yesterday.  EXAM: LEFT KNEE - COMPLETE 4+ VIEW  COMPARISON:  None.  FINDINGS: There is no evidence of fracture, dislocation, or joint effusion. There is no evidence of arthropathy or other focal bone abnormality. Soft tissues are unremarkable.  IMPRESSION: Normal examination.   Electronically Signed   By: Claudie Revering M.D.   On: 03/23/2015 20:59   I have personally reviewed and evaluated these images and lab results as part of my medical decision-making.    MDM   Final diagnoses:  Knee effusion, left    xrays reviewed - no fractures, has effusion - no fractures - immobilized, crutches, RICE, pt in agreement.  I have personally viewed and interpreted the imaging and agree with radiologist interpretation.  Meds given in ED:  Medications  ibuprofen (ADVIL,MOTRIN) tablet 800 mg (800 mg Oral Given 03/23/15 1948)    Discharge Medication List as of 03/23/2015  9:12 PM    START taking these medications   Details  ibuprofen (ADVIL,MOTRIN) 800 MG tablet Take 1 tablet (800 mg total) by mouth 3 (three) times daily., Starting 03/23/2015, Until Discontinued, Print        I personally performed the services described in this documentation, which was scribed in my presence. The recorded information has been reviewed and is accurate.      Noemi Chapel, MD 03/23/15 2325

## 2015-03-23 NOTE — Discharge Instructions (Signed)
Please obtain all of your results from medical records or have your doctors office obtain the results - share them with your doctor - you should be seen at your doctors office in the next 2 days. Call today to arrange your follow up. Take the medications as prescribed. Please review all of the medicines and only take them if you do not have an allergy to them. Please be aware that if you are taking birth control pills, taking other prescriptions, ESPECIALLY ANTIBIOTICS may make the birth control ineffective - if this is the case, either do not engage in sexual activity or use alternative methods of birth control such as condoms until you have finished the medicine and your family doctor says it is OK to restart them. If you are on a blood thinner such as COUMADIN, be aware that any other medicine that you take may cause the coumadin to either work too much, or not enough - you should have your coumadin level rechecked in next 7 days if this is the case.  ?  It is also a possibility that you have an allergic reaction to any of the medicines that you have been prescribed - Everybody reacts differently to medications and while MOST people have no trouble with most medicines, you may have a reaction such as nausea, vomiting, rash, swelling, shortness of breath. If this is the case, please stop taking the medicine immediately and contact your physician.  ?  You should return to the ER if you develop severe or worsening symptoms.    Knee Effusion The medical term for having fluid in your knee is effusion. This is often due to an internal derangement of the knee. This means something is wrong inside the knee. Some of the causes of fluid in the knee may be torn cartilage, a torn ligament, or bleeding into the joint from an injury. Your knee is likely more difficult to bend and move. This is often because there is increased pain and pressure in the joint. The time it takes for recovery from a knee effusion depends on  different factors, including:   Type of injury.  Your age.  Physical and medical conditions.  Rehabilitation Strategies. How long you will be away from your normal activities will depend on what kind of knee problem you have and how much damage is present. Your knee has two types of cartilage. Articular cartilage covers the bone ends and lets your knee bend and move smoothly. Two menisci, thick pads of cartilage that form a rim inside the joint, help absorb shock and stabilize your knee. Ligaments bind the bones together and support your knee joint. Muscles move the joint, help support your knee, and take stress off the joint itself. CAUSES  Often an effusion in the knee is caused by an injury to one of the menisci. This is often a tear in the cartilage. Recovery after a meniscus injury depends on how much meniscus is damaged and whether you have damaged other knee tissue. Small tears may heal on their own with conservative treatment. Conservative means rest, limited weight bearing activity and muscle strengthening exercises. Your recovery may take up to 6 weeks.  TREATMENT  Larger tears may require surgery. Meniscus injuries may be treated during arthroscopy. Arthroscopy is a procedure in which your surgeon uses a small telescope like instrument to look in your knee. Your caregiver can make a more accurate diagnosis (learning what is wrong) by performing an arthroscopic procedure. If your injury is on  the inner margin of the meniscus, your surgeon may trim the meniscus back to a smooth rim. In other cases your surgeon will try to repair a damaged meniscus with stitches (sutures). This may make rehabilitation take longer, but may provide better long term result by helping your knee keep its shock absorption capabilities. Ligaments which are completely torn usually require surgery for repair. HOME CARE INSTRUCTIONS  Use crutches as instructed.  If a brace is applied, use as directed.  Once you  are home, an ice pack applied to your swollen knee may help with discomfort and help decrease swelling.  Keep your knee raised (elevated) when you are not up and around or on crutches.  Only take over-the-counter or prescription medicines for pain, discomfort, or fever as directed by your caregiver.  Your caregivers will help with instructions for rehabilitation of your knee. This often includes strengthening exercises.  You may resume a normal diet and activities as directed. SEEK MEDICAL CARE IF:   There is increased swelling in your knee.  You notice redness, swelling, or increasing pain in your knee.  An unexplained oral temperature above 102 F (38.9 C) develops. SEEK IMMEDIATE MEDICAL CARE IF:   You develop a rash.  You have difficulty breathing.  You have any allergic reactions from medications you may have been given.  There is severe pain with any motion of the knee. MAKE SURE YOU:   Understand these instructions.  Will watch your condition.  Will get help right away if you are not doing well or get worse. Document Released: 09/26/2003 Document Revised: 09/28/2011 Document Reviewed: 11/30/2007 [facility] Patient Information 2015 [place], [place]. This information is not intended to replace advice given to you by your health care provider. Make sure you discuss any questions you have with your health care provider.

## 2015-03-26 ENCOUNTER — Other Ambulatory Visit: Payer: Self-pay | Admitting: Otolaryngology

## 2015-03-26 DIAGNOSIS — E041 Nontoxic single thyroid nodule: Secondary | ICD-10-CM

## 2015-04-04 ENCOUNTER — Encounter (HOSPITAL_BASED_OUTPATIENT_CLINIC_OR_DEPARTMENT_OTHER): Payer: Self-pay

## 2015-04-04 ENCOUNTER — Emergency Department (HOSPITAL_BASED_OUTPATIENT_CLINIC_OR_DEPARTMENT_OTHER)
Admission: EM | Admit: 2015-04-04 | Discharge: 2015-04-05 | Disposition: A | Discharge status: Home / Self Care | Payer: Medicaid Other | Attending: Emergency Medicine | Admitting: Emergency Medicine

## 2015-04-04 DIAGNOSIS — Z3202 Encounter for pregnancy test, result negative: Secondary | ICD-10-CM | POA: Diagnosis not present

## 2015-04-04 DIAGNOSIS — Z862 Personal history of diseases of the blood and blood-forming organs and certain disorders involving the immune mechanism: Secondary | ICD-10-CM | POA: Diagnosis not present

## 2015-04-04 DIAGNOSIS — J45909 Unspecified asthma, uncomplicated: Secondary | ICD-10-CM | POA: Insufficient documentation

## 2015-04-04 DIAGNOSIS — Z7951 Long term (current) use of inhaled steroids: Secondary | ICD-10-CM | POA: Diagnosis not present

## 2015-04-04 DIAGNOSIS — K5900 Constipation, unspecified: Secondary | ICD-10-CM | POA: Diagnosis not present

## 2015-04-04 DIAGNOSIS — M549 Dorsalgia, unspecified: Secondary | ICD-10-CM | POA: Diagnosis not present

## 2015-04-04 DIAGNOSIS — Z880 Allergy status to penicillin: Secondary | ICD-10-CM | POA: Insufficient documentation

## 2015-04-04 DIAGNOSIS — E039 Hypothyroidism, unspecified: Secondary | ICD-10-CM | POA: Insufficient documentation

## 2015-04-04 DIAGNOSIS — K589 Irritable bowel syndrome without diarrhea: Secondary | ICD-10-CM | POA: Diagnosis not present

## 2015-04-04 DIAGNOSIS — Z79899 Other long term (current) drug therapy: Secondary | ICD-10-CM | POA: Diagnosis not present

## 2015-04-04 DIAGNOSIS — R1084 Generalized abdominal pain: Secondary | ICD-10-CM | POA: Diagnosis present

## 2015-04-04 DIAGNOSIS — IMO0001 Reserved for inherently not codable concepts without codable children: Secondary | ICD-10-CM

## 2015-04-04 LAB — URINALYSIS, ROUTINE W REFLEX MICROSCOPIC
Bilirubin Urine: NEGATIVE
Glucose, UA: NEGATIVE mg/dL
Hgb urine dipstick: NEGATIVE
Ketones, ur: NEGATIVE mg/dL
LEUKOCYTES UA: NEGATIVE
NITRITE: NEGATIVE
Protein, ur: NEGATIVE mg/dL
Specific Gravity, Urine: 1.029 (ref 1.005–1.030)
Urobilinogen, UA: 1 mg/dL (ref 0.0–1.0)
pH: 6 (ref 5.0–8.0)

## 2015-04-04 LAB — PREGNANCY, URINE: Preg Test, Ur: NEGATIVE

## 2015-04-04 MED ORDER — ALUM & MAG HYDROXIDE-SIMETH 200-200-20 MG/5ML PO SUSP
ORAL | Status: AC
  Filled 2015-04-04: qty 30

## 2015-04-04 MED ORDER — DICYCLOMINE HCL 10 MG PO CAPS
10.0000 mg | ORAL_CAPSULE | Freq: Once | ORAL | Status: AC
  Administered 2015-04-04: 10 mg via ORAL

## 2015-04-04 MED ORDER — ALUM & MAG HYDROXIDE-SIMETH 200-200-20 MG/5ML PO SUSP
30.0000 mL | Freq: Once | ORAL | Status: AC
  Administered 2015-04-04: 30 mL via ORAL

## 2015-04-04 MED ORDER — DICYCLOMINE HCL 10 MG PO CAPS
ORAL_CAPSULE | ORAL | Status: AC
  Filled 2015-04-04: qty 1

## 2015-04-04 NOTE — ED Provider Notes (Signed)
CSN: [id number]     Arrival date & time 04/04/15  2246 History  This chart was scribed for Demetra Moya, MD by Tula Nakayama, ED Scribe. This patient was seen in room MH06/MH06 and the patient's care was started at 11:47 PM.   Chief Complaint  Patient presents with  . Abdominal Pain   Patient is a 19 y.o. female presenting with abdominal pain. The history is provided by the patient. No language interpreter was used.  Abdominal Pain Pain location:  Generalized Pain quality: aching   Pain radiates to:  Does not radiate Pain severity:  Mild Onset quality:  Gradual Duration:  2 weeks Timing:  Intermittent Progression:  Partially resolved Chronicity:  Recurrent Context: not alcohol use, not sick contacts and not trauma   Relieved by:  None tried Ineffective treatments:  Acetaminophen Associated symptoms: no constipation, no diarrhea, no dysuria, no fever, no flatus, no nausea, no vaginal bleeding, no vaginal discharge and no vomiting   Risk factors: has not had multiple surgeries     HPI Comments: Jessica Caldwell is a 19 y.o. female with a history of IBS and hypothyroidism who presents to the Emergency Department complaining of intermittent, moderate abdominal pain and lower back pain that started 2 weeks ago. Her episodes last a few minutes to upwards of an hour. Her mother has administered Tylenol with no relief. Pt is on amitriptyline. Pt had a steak and cheese sandwich for dinner. Her last BM was yesterday. Pt denies a history of similar pain. She also denies diarrhea, constipation and dysuria as associated symptoms.  Past Medical History  Diagnosis Date  . Asthma   . Anemia   . IBS (irritable bowel syndrome)   . Hypothyroid    Past Surgical History  Procedure Laterality Date  . Knee surgery    . Video bronchoscopy Bilateral 06/29/2013    Procedure: VIDEO BRONCHOSCOPY WITHOUT FLUORO;  Surgeon: Brand Males, MD;  Location: [facility];  Service: Cardiopulmonary;  Laterality:  Bilateral;  . Tonsillectomy     Family History  Problem Relation Age of Onset  . Asthma Paternal Grandfather   . Asthma Paternal Grandmother   . Heart disease Maternal Grandfather   . Heart disease Paternal Grandfather   . Heart disease Paternal Uncle   . Clotting disorder Paternal Grandmother   . Rheum arthritis Paternal Grandmother   . Cancer Paternal Grandfather     and several uncles.    Social History  Substance Use Topics  . Smoking status: Never Smoker   . Smokeless tobacco: None  . Alcohol Use: No   OB History    No data available     Review of Systems  Constitutional: Negative for fever.  Gastrointestinal: Positive for abdominal pain. Negative for nausea, vomiting, diarrhea, constipation and flatus.  Genitourinary: Negative for dysuria, vaginal bleeding and vaginal discharge.  Musculoskeletal: Positive for back pain.  All other systems reviewed and are negative.   Allergies  Azithromycin; Augmentin; Oxycodone-acetaminophen; Penicillins; Codeine; and Sulfa antibiotics  Home Medications   Prior to Admission medications   Medication Sig Start Date End Date Taking? Authorizing Provider  AMITRIPTYLINE HCL PO Take by mouth.    Historical Provider, MD  beclomethasone (QVAR) 40 MCG/ACT inhaler Inhale 2 puffs into the lungs.    Historical Provider, MD  ibuprofen (ADVIL,MOTRIN) 800 MG tablet Take 1 tablet (800 mg total) by mouth 3 (three) times daily. 03/23/15   Noemi Chapel, MD  levothyroxine (SYNTHROID, LEVOTHROID) 50 MCG tablet Take 1 tablet (50 mcg  total) by mouth daily. 01/17/15   Midge Minium, MD  montelukast (SINGULAIR) 10 MG tablet TAKE ONE TABLET BY MOUTH AT BEDTIME 10/08/14   Midge Minium, MD  norelgestromin-ethinyl estradiol (ORTHO EVRA) 150-35 MCG/24HR transdermal patch Place 1 patch onto the skin. 10/08/14   Historical Provider, MD  pantoprazole (PROTONIX) 20 MG tablet Take 20 mg by mouth. 12/29/11   Historical Provider, MD  PROAIR HFA 108 (90 BASE)  MCG/ACT inhaler INHALE TWO PUFFS BY MOUTH EVERY 4 HOURS AS NEEDED FOR WHEEZING OR FOR SHORTNESS OF BREATH 01/16/15   Midge Minium, MD   BP 139/67 mmHg  Pulse 93  Temp(Src) 98.2 F (36.8 C) (Oral)  Resp 18  Ht 5\' 4"  (1.626 m)  Wt 230 lb (104.327 kg)  BMI 39.46 kg/m2  SpO2 100%  LMP 03/14/2015 Physical Exam  Constitutional: She is oriented to person, place, and time. She appears well-developed and well-nourished. No distress.  HENT:  Head: Normocephalic and atraumatic.  Mouth/Throat: Oropharynx is clear and moist. No oropharyngeal exudate.  Eyes: Conjunctivae and EOM are normal. Pupils are equal, round, and reactive to light.  Neck: Normal range of motion. Neck supple. No tracheal deviation present.  Cardiovascular: Normal rate, regular rhythm, normal heart sounds and intact distal pulses.   Pulmonary/Chest: Effort normal and breath sounds normal. No respiratory distress. She has no wheezes. She has no rales.  Abdominal: Soft. She exhibits no distension and no mass. There is no tenderness. There is no rebound and no guarding.  Hyperactive bowel sounds Stool palpated in transverse and descending colon  Musculoskeletal: Normal range of motion.  Neurological: She is alert and oriented to person, place, and time. She has normal reflexes.  Skin: Skin is warm and dry.  Psychiatric: She has a normal mood and affect. Her behavior is normal.  Nursing note and vitals reviewed.   ED Course  Procedures   DIAGNOSTIC STUDIES: Oxygen Saturation is 100% on RA, normal by my interpretation.    COORDINATION OF CARE: 11:58 PM Discussed treatment plan with pt at bedside and pt agreed to plan.   Labs Review Labs Reviewed  URINALYSIS, ROUTINE W REFLEX MICROSCOPIC (NOT AT [facility])  PREGNANCY, URINE    Imaging Review No results found. I have personally reviewed and evaluated these images and lab results as part of my medical decision-making.   EKG Interpretation None      MDM   Final  diagnoses:  None    Results for orders placed or performed during the hospital encounter of 04/04/15  Urinalysis, Routine w reflex microscopic (not at [facility])  Result Value Ref Range   Color, Urine YELLOW YELLOW   APPearance CLEAR CLEAR   Specific Gravity, Urine 1.029 1.005 - 1.030   pH 6.0 5.0 - 8.0   Glucose, UA NEGATIVE NEGATIVE mg/dL   Hgb urine dipstick NEGATIVE NEGATIVE   Bilirubin Urine NEGATIVE NEGATIVE   Ketones, ur NEGATIVE NEGATIVE mg/dL   Protein, ur NEGATIVE NEGATIVE mg/dL   Urobilinogen, UA 1.0 0.0 - 1.0 mg/dL   Nitrite NEGATIVE NEGATIVE   Leukocytes, UA NEGATIVE NEGATIVE  Pregnancy, urine  Result Value Ref Range   Preg Test, Ur NEGATIVE NEGATIVE   Dg Knee Complete 4 Views Left  03/23/2015   CLINICAL DATA:  Left knee pain and swelling since yesterday.  EXAM: LEFT KNEE - COMPLETE 4+ VIEW  COMPARISON:  None.  FINDINGS: There is no evidence of fracture, dislocation, or joint effusion. There is no evidence of arthropathy or other focal bone  abnormality. Soft tissues are unremarkable.  IMPRESSION: Normal examination.   Electronically Signed   By: Claudie Revering M.D.   On: 03/23/2015 20:59   Dg Abd Acute W/chest  04/05/2015   CLINICAL DATA:  Diffuse abdominal pain today.  Vomiting.  EXAM: DG ABDOMEN ACUTE W/ 1V CHEST  COMPARISON:  Chest radiographs 02/10/2015.  FINDINGS: The cardiomediastinal contours are normal. The lungs are hypo aerated but clear. There is no free intra-abdominal air. No dilated bowel loops to suggest obstruction. Small volume of stool throughout the colon. No radiopaque calculi. No acute osseous abnormalities are seen.  IMPRESSION: Normal chest and abdominal radiographs.   Electronically Signed   By: Jeb Levering M.D.   On: 04/05/2015 00:54    Suspect this is the patient's IBS.  Exam and vitals are benign and reassuring there are no non verbal signs of pain.  Will refer back to her IBS specialist for ongoing treatment  I personally performed the services  described in this documentation, which was scribed in my presence. The recorded information has been reviewed and is accurate.     Veatrice Kells, MD 04/05/15 [phone]

## 2015-04-04 NOTE — ED Notes (Signed)
C/o lower abd and back pain x 2-3 weeks,nausea in the am

## 2015-04-05 ENCOUNTER — Emergency Department (HOSPITAL_BASED_OUTPATIENT_CLINIC_OR_DEPARTMENT_OTHER): Payer: Medicaid Other

## 2015-04-05 ENCOUNTER — Encounter (HOSPITAL_BASED_OUTPATIENT_CLINIC_OR_DEPARTMENT_OTHER): Payer: Self-pay | Admitting: Emergency Medicine

## 2015-04-05 MED ORDER — POLYETHYLENE GLYCOL 3350 17 GM/SCOOP PO POWD: 17.0000 g | Freq: Every day | ORAL | Status: DC

## 2015-04-05 NOTE — Discharge Instructions (Signed)

## 2015-04-11 ENCOUNTER — Ambulatory Visit
Admission: RE | Admit: 2015-04-11 | Discharge: 2015-04-11 | Disposition: A | Discharge status: Home / Self Care | Payer: Medicaid Other | Source: Ambulatory Visit | Attending: Otolaryngology | Admitting: Otolaryngology

## 2015-04-11 ENCOUNTER — Other Ambulatory Visit: Payer: Medicaid Other

## 2015-04-11 ENCOUNTER — Other Ambulatory Visit: Payer: Self-pay | Admitting: Otolaryngology

## 2015-04-11 DIAGNOSIS — E041 Nontoxic single thyroid nodule: Secondary | ICD-10-CM

## 2015-04-16 ENCOUNTER — Other Ambulatory Visit: Payer: Medicaid Other

## 2015-04-22 ENCOUNTER — Other Ambulatory Visit: Payer: Self-pay | Admitting: Family Medicine

## 2015-05-08 ENCOUNTER — Ambulatory Visit (INDEPENDENT_AMBULATORY_CARE_PROVIDER_SITE_OTHER): Payer: Self-pay | Admitting: Family Medicine

## 2015-05-08 ENCOUNTER — Encounter: Payer: Self-pay | Admitting: Family Medicine

## 2015-05-08 VITALS — BP 112/80 | HR 94 | Temp 98.3°F | Resp 17 | Ht 64.0 in | Wt 250.5 lb

## 2015-05-08 DIAGNOSIS — H6690 Otitis media, unspecified, unspecified ear: Secondary | ICD-10-CM | POA: Insufficient documentation

## 2015-05-08 DIAGNOSIS — N946 Dysmenorrhea, unspecified: Secondary | ICD-10-CM

## 2015-05-08 DIAGNOSIS — J4541 Moderate persistent asthma with (acute) exacerbation: Secondary | ICD-10-CM

## 2015-05-08 DIAGNOSIS — H6693 Otitis media, unspecified, bilateral: Secondary | ICD-10-CM

## 2015-05-08 MED ORDER — ALBUTEROL SULFATE HFA 108 (90 BASE) MCG/ACT IN AERS: INHALATION_SPRAY | RESPIRATORY_TRACT | Status: DC

## 2015-05-08 MED ORDER — BECLOMETHASONE DIPROPIONATE 40 MCG/ACT IN AERS: 2.0000 | INHALATION_SPRAY | Freq: Two times a day (BID) | RESPIRATORY_TRACT | Status: DC

## 2015-05-08 MED ORDER — DOXYCYCLINE HYCLATE 100 MG PO TABS: 100.0000 mg | ORAL_TABLET | Freq: Two times a day (BID) | ORAL | Status: DC

## 2015-05-08 MED ORDER — MONTELUKAST SODIUM 10 MG PO TABS: 10.0000 mg | ORAL_TABLET | Freq: Every day | ORAL | Status: DC

## 2015-05-08 NOTE — Progress Notes (Signed)
   Subjective:    Patient ID: Jessica Caldwell, female    DOB: [birth year], 19 y.o.   MRN: [id number]  HPI URI- 'i've been having some fever, i've been coughing and my ribs hurt'.  sxs started ~10 days ago.  Tm 101.  Last temp was last night.  No sinus pain/pressure.  + nasal congestion.  No ear pain- bilateral ear fullness.  + sick contacts.  + nausea but no vomiting.  Cough is dry- not productive.  Asthma- pt needs refill on singulair, albuterol, qvar.  Dysmenorrhea- continues to have sxs despite use of Ortho-Evra patch (not prescribed by this office).  Asking for referral to GYN.   Review of Systems For ROS see HPI     Objective:   Physical Exam  Constitutional: She is oriented to person, place, and time. She appears well-developed and well-nourished. No distress.  obese  HENT:  Head: Normocephalic and atraumatic.  Right Ear: Tympanic membrane is erythematous and bulging.  Left Ear: Tympanic membrane is erythematous and bulging.  Nose: Mucosal edema and rhinorrhea present. Right sinus exhibits no maxillary sinus tenderness and no frontal sinus tenderness. Left sinus exhibits no maxillary sinus tenderness and no frontal sinus tenderness.  Mouth/Throat: Mucous membranes are normal. Posterior oropharyngeal erythema (w/ PND) present.  Eyes: Conjunctivae and EOM are normal. Pupils are equal, round, and reactive to light.  Neck: Normal range of motion. Neck supple.  Cardiovascular: Normal rate, regular rhythm and normal heart sounds.   Pulmonary/Chest: Effort normal and breath sounds normal. No respiratory distress. She has no wheezes. She has no rales.  + barking cough  Lymphadenopathy:    She has no cervical adenopathy.  Neurological: She is alert and oriented to person, place, and time.  Skin: Skin is warm and dry.  Vitals reviewed.         Assessment & Plan:

## 2015-05-08 NOTE — Assessment & Plan Note (Signed)
New to provider, ongoing for pt.  She is currently on Ortho-Evra patch from her previous provider.  She reports this is no longer controlling her sxs.  I discussed that the patch is not reliable hormone distribution, particularly in someone that is obese.  Mom is asking for GYN referral- order placed.  Stressed need for pt to use condoms for birth control until she is on a more effective method.

## 2015-05-08 NOTE — Patient Instructions (Signed)
Follow up as needed Start the Doxycyline twice daily- take w/ food Drink plenty of fluids Mucinex DM or Delsym for cough/congestion We'll call you with your GYN appt I refilled your inhalers and singulair Call with any questions or concerns Hang in there!!!

## 2015-05-08 NOTE — Assessment & Plan Note (Signed)
Start abx- very limited in selection due to pt's allergies.  Refills provided on singulair and inhalers.  Will continue to follow.

## 2015-05-08 NOTE — Assessment & Plan Note (Signed)
New.  Pt w/ double ear infxn- likely due in part to untreated seasonal allergies.  Start abx.  Reviewed supportive care and red flags that should prompt return.  Pt expressed understanding and is in agreement w/ plan.

## 2015-05-16 ENCOUNTER — Ambulatory Visit (INDEPENDENT_AMBULATORY_CARE_PROVIDER_SITE_OTHER): Payer: Self-pay | Admitting: Family Medicine

## 2015-05-16 ENCOUNTER — Encounter: Payer: Self-pay | Admitting: Family Medicine

## 2015-05-16 ENCOUNTER — Ambulatory Visit (HOSPITAL_BASED_OUTPATIENT_CLINIC_OR_DEPARTMENT_OTHER)
Admission: RE | Admit: 2015-05-16 | Discharge: 2015-05-16 | Disposition: A | Discharge status: Home / Self Care | Payer: Medicaid Other | Source: Ambulatory Visit | Attending: Family Medicine | Admitting: Family Medicine

## 2015-05-16 VITALS — BP 101/77 | HR 91 | Temp 98.0°F | Ht 65.0 in | Wt 249.0 lb

## 2015-05-16 DIAGNOSIS — R042 Hemoptysis: Secondary | ICD-10-CM

## 2015-05-16 DIAGNOSIS — J45909 Unspecified asthma, uncomplicated: Secondary | ICD-10-CM | POA: Insufficient documentation

## 2015-05-16 DIAGNOSIS — R05 Cough: Secondary | ICD-10-CM | POA: Insufficient documentation

## 2015-05-16 DIAGNOSIS — J45901 Unspecified asthma with (acute) exacerbation: Secondary | ICD-10-CM

## 2015-05-16 DIAGNOSIS — J4541 Moderate persistent asthma with (acute) exacerbation: Secondary | ICD-10-CM

## 2015-05-16 MED ORDER — PREDNISONE 20 MG PO TABS: 20.0000 mg | ORAL_TABLET | Freq: Two times a day (BID) | ORAL | Status: DC

## 2015-05-16 MED ORDER — AMOXICILLIN 500 MG PO CAPS: 500.0000 mg | ORAL_CAPSULE | Freq: Three times a day (TID) | ORAL | Status: DC

## 2015-05-16 NOTE — Patient Instructions (Signed)

## 2015-05-16 NOTE — Progress Notes (Signed)
Pre visit review using our clinic review tool, if applicable. No additional management support is needed unless otherwise documented below in the visit note. 

## 2015-05-18 LAB — QUANTIFERON TB GOLD ASSAY (BLOOD)
Interferon Gamma Release Assay: NEGATIVE
Mitogen value: >10 IU/mL
QUANTIFERON NIL VALUE: 0.05 [IU]/mL
Quantiferon Tb Ag Minus Nil Value: 0.03 IU/mL
TB AG VALUE: 0.08 [IU]/mL

## 2015-05-26 NOTE — Progress Notes (Signed)
Subjective:    Patient ID: Jessica Caldwell, female    DOB: [birth year], 19 y.o.   MRN: [id number]  Chief Complaint  Patient presents with  . Cough  . Shortness of Breath    HPI Patient is in today for cough. She's been having worsening shortness of breath for several days now. She's had fevers and chills, malaise and myalgias. She was in contact with her niece when she had similar symptoms before symptoms started. The cough is keeping her up at night and making her chest hurt when she coughs. Denies palpGI or GU c/o. Taking meds as prescribed  Past Medical History  Diagnosis Date  . Asthma   . Anemia   . IBS (irritable bowel syndrome)   . Hypothyroid     Past Surgical History  Procedure Laterality Date  . Knee surgery    . Video bronchoscopy Bilateral 06/29/2013    Procedure: VIDEO BRONCHOSCOPY WITHOUT FLUORO;  Surgeon: Brand Males, MD;  Location: [facility];  Service: Cardiopulmonary;  Laterality: Bilateral;  . Tonsillectomy      Family History  Problem Relation Age of Onset  . Asthma Paternal Grandfather   . Asthma Paternal Grandmother   . Heart disease Maternal Grandfather   . Heart disease Paternal Grandfather   . Heart disease Paternal Uncle   . Clotting disorder Paternal Grandmother   . Rheum arthritis Paternal Grandmother   . Cancer Paternal Grandfather     and several uncles.     Social History   Social History  . Marital Status: Single    Spouse Name: N/A  . Number of Children: N/A  . Years of Education: N/A   Occupational History  . Not on file.   Social History Main Topics  . Smoking status: Never Smoker   . Smokeless tobacco: Not on file  . Alcohol Use: No  . Drug Use: No  . Sexual Activity: Yes    Birth Control/ Protection: None   Other Topics Concern  . Not on file   Social History Narrative    Outpatient Prescriptions Prior to Visit  Medication Sig Dispense Refill  . albuterol (PROAIR HFA) 108 (90 BASE) MCG/ACT inhaler INHALE  TWO PUFFS BY MOUTH EVERY 4 HOURS AS NEEDED FOR WHEEZING OR FOR SHORTNESS OF BREATH 1 Inhaler 6  . AMITRIPTYLINE HCL PO Take by mouth.    . beclomethasone (QVAR) 40 MCG/ACT inhaler Inhale 2 puffs into the lungs 2 (two) times daily. 1 Inhaler 6  . doxycycline (VIBRA-TABS) 100 MG tablet Take 1 tablet (100 mg total) by mouth 2 (two) times daily. 20 tablet 0  . ibuprofen (ADVIL,MOTRIN) 800 MG tablet Take 1 tablet (800 mg total) by mouth 3 (three) times daily. 21 tablet 0  . levothyroxine (SYNTHROID, LEVOTHROID) 50 MCG tablet Take 1 tablet (50 mcg total) by mouth daily. 30 tablet 6  . montelukast (SINGULAIR) 10 MG tablet Take 1 tablet (10 mg total) by mouth at bedtime. 30 tablet 6  . norelgestromin-ethinyl estradiol (ORTHO EVRA) 150-35 MCG/24HR transdermal patch Place 1 patch onto the skin.    . pantoprazole (PROTONIX) 20 MG tablet Take 20 mg by mouth.    . polyethylene glycol powder (MIRALAX) powder Take 17 g by mouth daily. 255 g 0   No facility-administered medications prior to visit.    Allergies  Allergen Reactions  . Azithromycin Hives, Shortness Of Breath and Nausea And Vomiting  . Augmentin [Amoxicillin-Pot Clavulanate] Nausea And Vomiting and Hives  . Oxycodone-Acetaminophen Nausea And Vomiting  .  Penicillins Rash    vomits  . Codeine Hives and Nausea And Vomiting  . Sulfa Antibiotics Hives and Nausea And Vomiting    Review of Systems  Constitutional: Positive for fever and chills. Negative for malaise/fatigue.  HENT: Positive for congestion.   Eyes: Negative for discharge.  Respiratory: Positive for cough, sputum production, shortness of breath and wheezing.   Cardiovascular: Negative for chest pain, palpitations and leg swelling.  Gastrointestinal: Negative for nausea and abdominal pain.  Genitourinary: Negative for dysuria.  Musculoskeletal: Negative for falls.  Skin: Negative for rash.  Neurological: Negative for loss of consciousness and headaches.  Endo/Heme/Allergies:  Negative for environmental allergies.  Psychiatric/Behavioral: Negative for depression. The patient is not nervous/anxious.        Objective:    Physical Exam  Constitutional: She is oriented to person, place, and time. She appears well-developed and well-nourished. No distress.  HENT:  Head: Normocephalic and atraumatic.  Nose: Nose normal.  Eyes: Right eye exhibits no discharge. Left eye exhibits no discharge.  Neck: Normal range of motion. Neck supple.  Cardiovascular: Normal rate and regular rhythm.   No murmur heard. Pulmonary/Chest: Effort normal. No respiratory distress. She has wheezes. She has no rales. She exhibits no tenderness.  Abdominal: Soft. Bowel sounds are normal. There is no tenderness.  Musculoskeletal: She exhibits no edema.  Neurological: She is alert and oriented to person, place, and time.  Skin: Skin is warm and dry.  Psychiatric: She has a normal mood and affect.  Nursing note and vitals reviewed.   BP 101/77 mmHg  Pulse 91  Temp(Src) 98 F (36.7 C) (Oral)  Ht 5\' 5"  (1.651 m)  Wt 249 lb (112.946 kg)  BMI 41.44 kg/m2  SpO2 99% Wt Readings from Last 3 Encounters:  05/16/15 249 lb (112.946 kg) (99 %*, Z = 2.47)  05/08/15 250 lb 8 oz (113.626 kg) (99 %*, Z = 2.48)  04/04/15 230 lb (104.327 kg) (99 %*, Z = 2.30)   * Growth percentiles are based on CDC 2-20 Years data.     Lab Results  Component Value Date   WBC 7.3 02/10/2015   HGB 11.7* 02/10/2015   HCT 36.8 02/10/2015   PLT 248 02/10/2015   GLUCOSE 89 02/10/2015   NA 139 02/10/2015   K 4.3 02/10/2015   CL 108 02/10/2015   CREATININE 0.71 02/10/2015   BUN 8 02/10/2015   CO2 24 02/10/2015   TSH 3.124 02/22/2015   HGBA1C 5.6 01/16/2015    Lab Results  Component Value Date   TSH 3.124 02/22/2015   Lab Results  Component Value Date   WBC 7.3 02/10/2015   HGB 11.7* 02/10/2015   HCT 36.8 02/10/2015   MCV 87.0 02/10/2015   PLT 248 02/10/2015   Lab Results  Component Value Date    NA 139 02/10/2015   K 4.3 02/10/2015   CO2 24 02/10/2015   GLUCOSE 89 02/10/2015   BUN 8 02/10/2015   CREATININE 0.71 02/10/2015   CALCIUM 8.8* 02/10/2015   ANIONGAP 7 02/10/2015   GFR 118.92 01/16/2015   No results found for: CHOL No results found for: HDL No results found for: LDLCALC No results found for: TRIG No results found for: CHOLHDL Lab Results  Component Value Date   HGBA1C 5.6 01/16/2015       Assessment & Plan:   Problem List Items Addressed This Visit    Moderate persistent asthma with exacerbation    Started on antibiotics, steroids and continue current inhalers.  Relevant Medications   predniSONE (DELTASONE) 20 MG tablet   Hemoptysis   Relevant Medications   predniSONE (DELTASONE) 20 MG tablet   amoxicillin (AMOXIL) 500 MG capsule   Other Relevant Orders   DG Chest 2 View (Completed)   Quantiferon tb gold assay (Completed)   CBC    Other Visit Diagnoses    Asthma with acute exacerbation, unspecified asthma severity    -  Primary    Relevant Medications    predniSONE (DELTASONE) 20 MG tablet    amoxicillin (AMOXIL) 500 MG capsule    Other Relevant Orders    DG Chest 2 View (Completed)    Quantiferon tb gold assay (Completed)    CBC       I am having Ms. Zadrozny start on predniSONE and amoxicillin. I am also having her maintain her AMITRIPTYLINE HCL PO, norelgestromin-ethinyl estradiol, pantoprazole, levothyroxine, ibuprofen, polyethylene glycol powder, doxycycline, beclomethasone, montelukast, and albuterol.  Meds ordered this encounter  Medications  . predniSONE (DELTASONE) 20 MG tablet    Sig: Take 1 tablet (20 mg total) by mouth 2 (two) times daily with a meal.    Dispense:  10 tablet    Refill:  0  . amoxicillin (AMOXIL) 500 MG capsule    Sig: Take 1 capsule (500 mg total) by mouth 3 (three) times daily.    Dispense:  30 capsule    Refill:  0     Penni Homans, MD

## 2015-05-26 NOTE — Assessment & Plan Note (Signed)
Started on antibiotics, steroids and continue current inhalers.

## 2015-06-05 ENCOUNTER — Ambulatory Visit: Payer: Medicaid Other | Admitting: Family Medicine

## 2015-06-05 ENCOUNTER — Other Ambulatory Visit: Payer: Self-pay | Admitting: Emergency Medicine

## 2015-06-05 DIAGNOSIS — Z0289 Encounter for other administrative examinations: Secondary | ICD-10-CM

## 2015-06-05 DIAGNOSIS — J4521 Mild intermittent asthma with (acute) exacerbation: Secondary | ICD-10-CM

## 2015-06-07 ENCOUNTER — Telehealth: Payer: Self-pay | Admitting: Family Medicine

## 2015-06-07 NOTE — Telephone Encounter (Signed)
Pt was no show 06/05/15 8:30am acute appt, 2nd no show, pt did not reschedule, charge or no charge?

## 2015-06-07 NOTE — Telephone Encounter (Signed)
Yes charge 

## 2015-06-22 ENCOUNTER — Encounter (HOSPITAL_BASED_OUTPATIENT_CLINIC_OR_DEPARTMENT_OTHER): Payer: Self-pay | Admitting: Emergency Medicine

## 2015-06-22 ENCOUNTER — Emergency Department (HOSPITAL_BASED_OUTPATIENT_CLINIC_OR_DEPARTMENT_OTHER): Payer: Medicaid Other

## 2015-06-22 ENCOUNTER — Emergency Department (HOSPITAL_BASED_OUTPATIENT_CLINIC_OR_DEPARTMENT_OTHER)
Admission: EM | Admit: 2015-06-22 | Discharge: 2015-06-22 | Disposition: A | Discharge status: Home / Self Care | Payer: Medicaid Other | Attending: Emergency Medicine | Admitting: Emergency Medicine

## 2015-06-22 DIAGNOSIS — J069 Acute upper respiratory infection, unspecified: Secondary | ICD-10-CM | POA: Diagnosis not present

## 2015-06-22 DIAGNOSIS — R0602 Shortness of breath: Secondary | ICD-10-CM | POA: Diagnosis present

## 2015-06-22 DIAGNOSIS — J45901 Unspecified asthma with (acute) exacerbation: Secondary | ICD-10-CM | POA: Diagnosis not present

## 2015-06-22 DIAGNOSIS — Z7951 Long term (current) use of inhaled steroids: Secondary | ICD-10-CM | POA: Insufficient documentation

## 2015-06-22 DIAGNOSIS — Z792 Long term (current) use of antibiotics: Secondary | ICD-10-CM | POA: Insufficient documentation

## 2015-06-22 DIAGNOSIS — Z862 Personal history of diseases of the blood and blood-forming organs and certain disorders involving the immune mechanism: Secondary | ICD-10-CM | POA: Insufficient documentation

## 2015-06-22 DIAGNOSIS — Z79899 Other long term (current) drug therapy: Secondary | ICD-10-CM | POA: Diagnosis not present

## 2015-06-22 DIAGNOSIS — Z79818 Long term (current) use of other agents affecting estrogen receptors and estrogen levels: Secondary | ICD-10-CM | POA: Diagnosis not present

## 2015-06-22 DIAGNOSIS — Z8719 Personal history of other diseases of the digestive system: Secondary | ICD-10-CM | POA: Diagnosis not present

## 2015-06-22 DIAGNOSIS — Z880 Allergy status to penicillin: Secondary | ICD-10-CM | POA: Insufficient documentation

## 2015-06-22 DIAGNOSIS — E039 Hypothyroidism, unspecified: Secondary | ICD-10-CM | POA: Diagnosis not present

## 2015-06-22 DIAGNOSIS — R042 Hemoptysis: Secondary | ICD-10-CM

## 2015-06-22 MED ORDER — ALBUTEROL SULFATE (2.5 MG/3ML) 0.083% IN NEBU
5.0000 mg | INHALATION_SOLUTION | Freq: Once | RESPIRATORY_TRACT | Status: AC
  Administered 2015-06-22: 5 mg via RESPIRATORY_TRACT
  Filled 2015-06-22: qty 6

## 2015-06-22 MED ORDER — IPRATROPIUM BROMIDE 0.02 % IN SOLN
0.5000 mg | Freq: Once | RESPIRATORY_TRACT | Status: AC
  Administered 2015-06-22: 0.5 mg via RESPIRATORY_TRACT
  Filled 2015-06-22: qty 2.5

## 2015-06-22 MED ORDER — AMOXICILLIN 500 MG PO CAPS: 500.0000 mg | ORAL_CAPSULE | Freq: Three times a day (TID) | ORAL | Status: DC

## 2015-06-22 MED ORDER — PREDNISONE 50 MG PO TABS
60.0000 mg | ORAL_TABLET | Freq: Once | ORAL | Status: AC
  Administered 2015-06-22: 60 mg via ORAL
  Filled 2015-06-22: qty 1

## 2015-06-22 MED ORDER — PREDNISONE 20 MG PO TABS: 20.0000 mg | ORAL_TABLET | Freq: Two times a day (BID) | ORAL | Status: DC

## 2015-06-22 MED ORDER — AMOXICILLIN 500 MG PO CAPS
500.0000 mg | ORAL_CAPSULE | Freq: Once | ORAL | Status: AC
  Administered 2015-06-22: 500 mg via ORAL
  Filled 2015-06-22: qty 1

## 2015-06-22 NOTE — Discharge Instructions (Signed)
Bronchospasm, Adult  A bronchospasm is a spasm or tightening of the airways going into the lungs. During a bronchospasm breathing becomes more difficult because the airways get smaller. When this happens there can be coughing, a whistling sound when breathing (wheezing), and difficulty breathing. Bronchospasm is often associated with asthma, but not all patients who experience a bronchospasm have asthma.  CAUSES   A bronchospasm is caused by inflammation or irritation of the airways. The inflammation or irritation may be triggered by:   · Allergies (such as to animals, pollen, food, or mold). Allergens that cause bronchospasm may cause wheezing immediately after exposure or many hours later.    · Infection. Viral infections are believed to be the most common cause of bronchospasm.    · Exercise.    · Irritants (such as pollution, cigarette smoke, strong odors, aerosol sprays, and paint fumes).    · Weather changes. Winds increase molds and pollens in the air. Rain refreshes the air by washing irritants out. Cold air may cause inflammation.    · Stress and emotional upset.    SIGNS AND SYMPTOMS   · Wheezing.    · Excessive nighttime coughing.    · Frequent or severe coughing with a simple cold.    · Chest tightness.    · Shortness of breath.    DIAGNOSIS   Bronchospasm is usually diagnosed through a history and physical exam. Tests, such as chest X-rays, are sometimes done to look for other conditions.  TREATMENT   · Inhaled medicines can be given to open up your airways and help you breathe. The medicines can be given using either an inhaler or a nebulizer machine.  · Corticosteroid medicines may be given for severe bronchospasm, usually when it is associated with asthma.  HOME CARE INSTRUCTIONS   · Always have a plan prepared for seeking medical care. Know when to call your health care provider and local emergency services (911 in the [place] Know where you can access local emergency care.  · Only take medicines as  directed by your health care provider.  · If you were prescribed an inhaler or nebulizer machine, ask your health care provider to explain how to use it correctly. Always use a spacer with your inhaler if you were given one.  · It is necessary to remain calm during an attack. Try to relax and breathe more slowly.   · Control your home environment in the following ways:      Change your heating and air conditioning filter at least once a month.      Limit your use of fireplaces and wood stoves.    Do not smoke and do not allow smoking in your home.      Avoid exposure to perfumes and fragrances.      Get rid of pests (such as roaches and mice) and their droppings.      Throw away plants if you see mold on them.      Keep your house clean and dust free.      Replace carpet with wood, tile, or vinyl flooring. Carpet can trap dander and dust.      Use allergy-proof pillows, mattress covers, and box spring covers.      Wash bed sheets and blankets every week in hot water and dry them in a dryer.      Use blankets that are made of polyester or cotton.      Wash hands frequently.  SEEK MEDICAL CARE IF:   · You have muscle aches.    · You have chest pain.    · The sputum changes from clear or   white to yellow, green, gray, or bloody.    · The sputum you cough up gets thicker.    · There are problems that may be related to the medicine you are given, such as a rash, itching, swelling, or trouble breathing.    SEEK IMMEDIATE MEDICAL CARE IF:   · You have worsening wheezing and coughing even after taking your prescribed medicines.    · You have increased difficulty breathing.    · You develop severe chest pain.  MAKE SURE YOU:   · Understand these instructions.  · Will watch your condition.  · Will get help right away if you are not doing well or get worse.     This information is not intended to replace advice given to you by your health care provider. Make sure you discuss any questions you have with your health care  provider.     Document Released: 07/09/2003 Document Revised: 07/27/2014 Document Reviewed: 12/26/2012  Elsevier Interactive Patient Education ©2016 [employer]

## 2015-06-22 NOTE — ED Notes (Signed)
Patient reports that she continues to have SOB after taking a treatment at home for her asthma. Reports that he ribs and chest hurt  - course unproductive cough

## 2015-06-22 NOTE — ED Provider Notes (Signed)
CSN: [id number]     Arrival date & time 06/22/15  2024 History   First MD Initiated Contact with Patient 06/22/15 2147     Chief Complaint  Patient presents with  . Shortness of Breath     (Consider location/radiation/quality/duration/timing/severity/associated sxs/prior Treatment) HPI   Patient presents to the ER with complaints of SOB that has been persisting for the past week. She has a nebulizer machine at home but the tubing is broken. Therefore her mother has been blowing the mist into the air. She reports her ribs and chest hurt. She is on QVAR, Singular, Synthroid, and albuterol for her persistent asthma. She was last on abx and steroids 3 months ago for bronchitis. She has not had any nausea, vomiting, diarrhea, fevers, lower extremity swelling, back pain. Patient is in no acute distress.  Past Medical History  Diagnosis Date  . Asthma   . Anemia   . IBS (irritable bowel syndrome)   . Hypothyroid    Past Surgical History  Procedure Laterality Date  . Knee surgery    . Video bronchoscopy Bilateral 06/29/2013    Procedure: VIDEO BRONCHOSCOPY WITHOUT FLUORO;  Surgeon: Brand Males, MD;  Location: [facility];  Service: Cardiopulmonary;  Laterality: Bilateral;  . Tonsillectomy     Family History  Problem Relation Age of Onset  . Asthma Paternal Grandfather   . Asthma Paternal Grandmother   . Heart disease Maternal Grandfather   . Heart disease Paternal Grandfather   . Heart disease Paternal Uncle   . Clotting disorder Paternal Grandmother   . Rheum arthritis Paternal Grandmother   . Cancer Paternal Grandfather     and several uncles.    Social History  Substance Use Topics  . Smoking status: Never Smoker   . Smokeless tobacco: None  . Alcohol Use: No   OB History    No data available     Review of Systems  All other systems reviewed and are negative.     Allergies  Azithromycin; Augmentin; Oxycodone-acetaminophen; Penicillins; Codeine; and Sulfa  antibiotics  Home Medications   Prior to Admission medications   Medication Sig Start Date End Date Taking? Authorizing Provider  albuterol (PROAIR HFA) 108 (90 BASE) MCG/ACT inhaler INHALE TWO PUFFS BY MOUTH EVERY 4 HOURS AS NEEDED FOR WHEEZING OR FOR SHORTNESS OF BREATH 05/08/15   Midge Minium, MD  AMITRIPTYLINE HCL PO Take by mouth.    Historical Provider, MD  amoxicillin (AMOXIL) 500 MG capsule Take 1 capsule (500 mg total) by mouth 3 (three) times daily. 06/22/15   Laura Radilla Carlota Raspberry, PA-C  beclomethasone (QVAR) 40 MCG/ACT inhaler Inhale 2 puffs into the lungs 2 (two) times daily. 05/08/15   Midge Minium, MD  doxycycline (VIBRA-TABS) 100 MG tablet Take 1 tablet (100 mg total) by mouth 2 (two) times daily. 05/08/15   Midge Minium, MD  ibuprofen (ADVIL,MOTRIN) 800 MG tablet Take 1 tablet (800 mg total) by mouth 3 (three) times daily. 03/23/15   Noemi Chapel, MD  levothyroxine (SYNTHROID, LEVOTHROID) 50 MCG tablet Take 1 tablet (50 mcg total) by mouth daily. 01/17/15   Midge Minium, MD  montelukast (SINGULAIR) 10 MG tablet Take 1 tablet (10 mg total) by mouth at bedtime. 05/08/15   Midge Minium, MD  norelgestromin-ethinyl estradiol (ORTHO EVRA) 150-35 MCG/24HR transdermal patch Place 1 patch onto the skin. 10/08/14   Historical Provider, MD  pantoprazole (PROTONIX) 20 MG tablet Take 20 mg by mouth. 12/29/11   Historical Provider, MD  polyethylene glycol  powder (MIRALAX) powder Take 17 g by mouth daily. 04/05/15   April Palumbo, MD  predniSONE (DELTASONE) 20 MG tablet Take 1 tablet (20 mg total) by mouth 2 (two) times daily with a meal. 06/22/15   Tupac Jeffus Carlota Raspberry, PA-C   BP 94/54 mmHg  Pulse 93  Temp(Src) 98 F (36.7 C) (Oral)  Resp 18  Ht 5\' 4"  (1.626 m)  Wt 108.863 kg  BMI 41.18 kg/m2  SpO2 100%  LMP 06/06/2015 Physical Exam  Constitutional: She appears well-developed and well-nourished. No distress.  HENT:  Head: Normocephalic and atraumatic.  Right Ear: Tympanic  membrane and ear canal normal.  Left Ear: Tympanic membrane and ear canal normal.  Nose: Nose normal.  Mouth/Throat: Uvula is midline and oropharynx is clear and moist.  Eyes: Pupils are equal, round, and reactive to light.  Neck: Normal range of motion. Neck supple.  Cardiovascular: Normal rate and regular rhythm.   Pulmonary/Chest: Effort normal. She has decreased breath sounds (diffuse). She has no wheezes. She has no rhonchi. She has no rales.  Dry cough  Abdominal: Soft.  Musculoskeletal:  No LE swelling  Neurological: She is alert.  Skin: Skin is warm and dry.  Nursing note and vitals reviewed.   ED Course  Procedures (including critical care time) Labs Review Labs Reviewed - No data to display  Imaging Review Dg Chest 2 View  06/22/2015  CLINICAL DATA:  Shortness of breath for 3 days. Bilateral rib and chest pain. History of asthma. EXAM: CHEST  2 VIEW COMPARISON:  05/16/2015 FINDINGS: Cardiomediastinal silhouette is normal. Mediastinal contours appear intact. There is no evidence of focal airspace consolidation, pleural effusion or pneumothorax. Osseous structures are without acute abnormality. Soft tissues are grossly normal. IMPRESSION: No active cardiopulmonary disease. Electronically Signed   By: Fidela Salisbury M.D.   On: 06/22/2015 20:49   I have personally reviewed and evaluated these images and lab results as part of my medical decision-making.   EKG Interpretation None      MDM   Final diagnoses:  Asthma exacerbation  URI (upper respiratory infection)    She has had significant improvement with the breathing treatment and declines a second. Chest xray unremarkable.  Medications  amoxicillin (AMOXIL) capsule 500 mg (not administered)  albuterol (PROVENTIL) (2.5 MG/3ML) 0.083% nebulizer solution 5 mg (5 mg Nebulization Given 06/22/15 2248)  ipratropium (ATROVENT) nebulizer solution 0.5 mg (0.5 mg Nebulization Given 06/22/15 2248)  predniSONE (DELTASONE)  tablet 60 mg (60 mg Oral Given 06/22/15 2304)    Rx: prednisone, Amoxicillin and given new hose for her nebulizer machine for home. -- also given referral to Pulmonologist.  Medications  amoxicillin (AMOXIL) capsule 500 mg (not administered)  albuterol (PROVENTIL) (2.5 MG/3ML) 0.083% nebulizer solution 5 mg (5 mg Nebulization Given 06/22/15 2248)  ipratropium (ATROVENT) nebulizer solution 0.5 mg (0.5 mg Nebulization Given 06/22/15 2248)  predniSONE (DELTASONE) tablet 60 mg (60 mg Oral Given 06/22/15 2304)     I feel the patient has had an appropriate workup for their chief complaint at this time and likelihood of emergent condition existing is low. Discussed s/sx that warrant return to the ED.  Filed Vitals:   06/22/15 2249 06/22/15 2306  BP:  94/54  Pulse: 84 93  Temp:    Resp: [address], PA-C 06/22/15 2319  Merrily Pew, MD 06/23/15 [phone]

## 2015-06-25 ENCOUNTER — Emergency Department (HOSPITAL_BASED_OUTPATIENT_CLINIC_OR_DEPARTMENT_OTHER): Payer: Medicaid Other

## 2015-06-25 ENCOUNTER — Encounter (HOSPITAL_BASED_OUTPATIENT_CLINIC_OR_DEPARTMENT_OTHER): Payer: Self-pay | Admitting: Emergency Medicine

## 2015-06-25 ENCOUNTER — Telehealth: Payer: Self-pay | Admitting: Family Medicine

## 2015-06-25 ENCOUNTER — Ambulatory Visit: Payer: Medicaid Other | Admitting: Family Medicine

## 2015-06-25 ENCOUNTER — Emergency Department (HOSPITAL_BASED_OUTPATIENT_CLINIC_OR_DEPARTMENT_OTHER)
Admission: EM | Admit: 2015-06-25 | Discharge: 2015-06-25 | Disposition: A | Discharge status: Home / Self Care | Payer: Medicaid Other | Attending: Emergency Medicine | Admitting: Emergency Medicine

## 2015-06-25 DIAGNOSIS — Z7952 Long term (current) use of systemic steroids: Secondary | ICD-10-CM | POA: Insufficient documentation

## 2015-06-25 DIAGNOSIS — Z8719 Personal history of other diseases of the digestive system: Secondary | ICD-10-CM | POA: Insufficient documentation

## 2015-06-25 DIAGNOSIS — Z880 Allergy status to penicillin: Secondary | ICD-10-CM | POA: Insufficient documentation

## 2015-06-25 DIAGNOSIS — Z792 Long term (current) use of antibiotics: Secondary | ICD-10-CM | POA: Diagnosis not present

## 2015-06-25 DIAGNOSIS — E039 Hypothyroidism, unspecified: Secondary | ICD-10-CM | POA: Insufficient documentation

## 2015-06-25 DIAGNOSIS — Z79899 Other long term (current) drug therapy: Secondary | ICD-10-CM | POA: Diagnosis not present

## 2015-06-25 DIAGNOSIS — Z79818 Long term (current) use of other agents affecting estrogen receptors and estrogen levels: Secondary | ICD-10-CM | POA: Diagnosis not present

## 2015-06-25 DIAGNOSIS — R0602 Shortness of breath: Secondary | ICD-10-CM | POA: Diagnosis present

## 2015-06-25 DIAGNOSIS — Z7951 Long term (current) use of inhaled steroids: Secondary | ICD-10-CM | POA: Insufficient documentation

## 2015-06-25 DIAGNOSIS — Z0289 Encounter for other administrative examinations: Secondary | ICD-10-CM

## 2015-06-25 DIAGNOSIS — J45901 Unspecified asthma with (acute) exacerbation: Secondary | ICD-10-CM | POA: Insufficient documentation

## 2015-06-25 MED ORDER — IPRATROPIUM-ALBUTEROL 0.5-2.5 (3) MG/3ML IN SOLN
3.0000 mL | RESPIRATORY_TRACT | Status: DC
  Administered 2015-06-25: 3 mL via RESPIRATORY_TRACT
  Filled 2015-06-25: qty 3

## 2015-06-25 MED ORDER — ALBUTEROL SULFATE (2.5 MG/3ML) 0.083% IN NEBU
5.0000 mg | INHALATION_SOLUTION | Freq: Once | RESPIRATORY_TRACT | Status: AC
  Administered 2015-06-25: 5 mg via RESPIRATORY_TRACT
  Filled 2015-06-25: qty 6

## 2015-06-25 MED ORDER — NAPROXEN 250 MG PO TABS
500.0000 mg | ORAL_TABLET | Freq: Once | ORAL | Status: AC
  Administered 2015-06-25: 500 mg via ORAL
  Filled 2015-06-25: qty 2

## 2015-06-25 MED ORDER — ALBUTEROL SULFATE (2.5 MG/3ML) 0.083% IN NEBU
2.5000 mg | INHALATION_SOLUTION | Freq: Once | RESPIRATORY_TRACT | Status: AC
  Administered 2015-06-25: 2.5 mg via RESPIRATORY_TRACT
  Filled 2015-06-25: qty 3

## 2015-06-25 NOTE — ED Provider Notes (Signed)
CSN: [id number]     Arrival date & time 06/25/15  0146 History   First MD Initiated Contact with Patient 06/25/15 0159     Chief Complaint  Patient presents with  . Shortness of Breath     (Consider location/radiation/quality/duration/timing/severity/associated sxs/prior Treatment) HPI  This is a 19 year old female with a history of asthma. She was seen in the ED on the third of this month for an acute exacerbation. She was treated with albuterol, prednisone and amoxicillin. Despite these she returns with worsening shortness of breath and a frequent cough. She describes it as feeling like deep breaths cause her to cough which causes her chest to hurt. She has had a fever as well. She denies nausea, vomiting and diarrhea.  Past Medical History  Diagnosis Date  . Asthma   . Anemia   . IBS (irritable bowel syndrome)   . Hypothyroid    Past Surgical History  Procedure Laterality Date  . Knee surgery    . Video bronchoscopy Bilateral 06/29/2013    Procedure: VIDEO BRONCHOSCOPY WITHOUT FLUORO;  Surgeon: Brand Males, MD;  Location: [facility];  Service: Cardiopulmonary;  Laterality: Bilateral;  . Tonsillectomy     Family History  Problem Relation Age of Onset  . Asthma Paternal Grandfather   . Asthma Paternal Grandmother   . Heart disease Maternal Grandfather   . Heart disease Paternal Grandfather   . Heart disease Paternal Uncle   . Clotting disorder Paternal Grandmother   . Rheum arthritis Paternal Grandmother   . Cancer Paternal Grandfather     and several uncles.    Social History  Substance Use Topics  . Smoking status: Never Smoker   . Smokeless tobacco: None  . Alcohol Use: No   OB History    No data available     Review of Systems  All other systems reviewed and are negative.   Allergies  Azithromycin; Augmentin; Oxycodone-acetaminophen; Penicillins; Codeine; and Sulfa antibiotics  Home Medications   Prior to Admission medications   Medication Sig  Start Date End Date Taking? Authorizing Provider  albuterol (PROAIR HFA) 108 (90 BASE) MCG/ACT inhaler INHALE TWO PUFFS BY MOUTH EVERY 4 HOURS AS NEEDED FOR WHEEZING OR FOR SHORTNESS OF BREATH 05/08/15  Yes Midge Minium, MD  amoxicillin (AMOXIL) 500 MG capsule Take 1 capsule (500 mg total) by mouth 3 (three) times daily. 06/22/15  Yes Tiffany Carlota Raspberry, PA-C  beclomethasone (QVAR) 40 MCG/ACT inhaler Inhale 2 puffs into the lungs 2 (two) times daily. 05/08/15  Yes Midge Minium, MD  levothyroxine (SYNTHROID, LEVOTHROID) 50 MCG tablet Take 1 tablet (50 mcg total) by mouth daily. 01/17/15  Yes Midge Minium, MD  montelukast (SINGULAIR) 10 MG tablet Take 1 tablet (10 mg total) by mouth at bedtime. 05/08/15  Yes Midge Minium, MD  predniSONE (DELTASONE) 20 MG tablet Take 1 tablet (20 mg total) by mouth 2 (two) times daily with a meal. 06/22/15  Yes Tiffany Carlota Raspberry, PA-C  AMITRIPTYLINE HCL PO Take by mouth.    Historical Provider, MD  doxycycline (VIBRA-TABS) 100 MG tablet Take 1 tablet (100 mg total) by mouth 2 (two) times daily. 05/08/15   Midge Minium, MD  ibuprofen (ADVIL,MOTRIN) 800 MG tablet Take 1 tablet (800 mg total) by mouth 3 (three) times daily. 03/23/15   Noemi Chapel, MD  norelgestromin-ethinyl estradiol (ORTHO EVRA) 150-35 MCG/24HR transdermal patch Place 1 patch onto the skin. 10/08/14   Historical Provider, MD  pantoprazole (PROTONIX) 20 MG tablet Take 20 mg  by mouth. 12/29/11   Historical Provider, MD  polyethylene glycol powder (MIRALAX) powder Take 17 g by mouth daily. 04/05/15   April Palumbo, MD   BP 122/68 mmHg  Pulse 98  Temp(Src) 98.2 F (36.8 C) (Oral)  Resp 22  Ht 5\' 4"  (1.626 m)  Wt 240 lb (108.863 kg)  BMI 41.18 kg/m2  SpO2 100%  LMP 06/06/2015   Physical Exam  General: Well-developed, obese female in no acute distress; appearance consistent with age of record HENT: normocephalic; atraumatic; pharynx normal Eyes: pupils equal, round and reactive to  light; extraocular muscles intact Neck: supple Heart: regular rate and rhythm Lungs: Decreased air movement bilaterally; shallow breaths; rhonchorous cough Abdomen: soft; nondistended; nontender; bowel sounds present Extremities: No deformity; full range of motion; pulses normal Neurologic: Awake, alert and oriented; motor function intact in all extremities and symmetric; no facial droop Skin: Warm and dry Psychiatric: Normal mood and affect    ED Course  Procedures (including critical care time)   MDM  Nursing notes and vitals signs, including pulse oximetry, reviewed.  Summary of this visit's results, reviewed by myself:  Imaging Studies: Dg Chest 2 View  06/25/2015  CLINICAL DATA:  Acute onset of cough and fever.  Initial encounter. EXAM: CHEST  2 VIEW COMPARISON:  Chest radiograph performed 06/22/2015 FINDINGS: The lungs are well-aerated. Mild bibasilar atelectasis is noted. There is no evidence of pleural effusion or pneumothorax. The heart is normal in size; the mediastinal contour is within normal limits. No acute osseous abnormalities are seen. IMPRESSION: Mild bibasilar atelectasis noted.  Lungs otherwise clear. Electronically Signed   By: Garald Balding M.D.   On: 06/25/2015 02:34   3:05 AM Air movement significantly improved after second neb treatment.     Shanon Rosser, MD 06/25/15 [phone]

## 2015-06-25 NOTE — ED Notes (Signed)
MD at bedside. 

## 2015-06-25 NOTE — ED Notes (Signed)
conitnues to have trouble breathing. Was seen on December 3rd for same.

## 2015-06-25 NOTE — ED Notes (Signed)
Patient transported to X-ray 

## 2015-06-26 ENCOUNTER — Ambulatory Visit (INDEPENDENT_AMBULATORY_CARE_PROVIDER_SITE_OTHER): Payer: Medicaid Other | Admitting: Family Medicine

## 2015-06-26 ENCOUNTER — Encounter: Payer: Self-pay | Admitting: Family Medicine

## 2015-06-26 VITALS — BP 106/72 | HR 88 | Temp 98.0°F | Resp 17 | Ht 64.0 in | Wt 254.4 lb

## 2015-06-26 DIAGNOSIS — J4541 Moderate persistent asthma with (acute) exacerbation: Secondary | ICD-10-CM | POA: Diagnosis not present

## 2015-06-26 MED ORDER — PREDNISONE 10 MG PO TABS: ORAL_TABLET | ORAL | Status: DC

## 2015-06-26 MED ORDER — IPRATROPIUM-ALBUTEROL 0.5-2.5 (3) MG/3ML IN SOLN
3.0000 mL | Freq: Once | RESPIRATORY_TRACT | Status: AC
  Administered 2015-06-26: 3 mL via RESPIRATORY_TRACT

## 2015-06-26 MED ORDER — PROMETHAZINE-DM 6.25-15 MG/5ML PO SYRP: 5.0000 mL | ORAL_SOLUTION | Freq: Four times a day (QID) | ORAL | Status: DC | PRN

## 2015-06-26 MED ORDER — BECLOMETHASONE DIPROPIONATE 80 MCG/ACT IN AERS: 2.0000 | INHALATION_SPRAY | Freq: Two times a day (BID) | RESPIRATORY_TRACT | Status: DC

## 2015-06-26 NOTE — Progress Notes (Signed)
   Subjective:    Patient ID: Jessica Caldwell, female    DOB: 04-05-1996, 19 y.o.   MRN: [id number]  HPI ER f/u- pt was seen on 12/3 and 12/6 w/ asthma exacerbations.  Pt has albuterol and Qvar inhalers.  Now on Prednisone.  Pt had normal CXR yesterday.  Pt continues to have SOB.  Mom reports nebulizer use twice daily.  Pt will finish prednisone tomorrow.  Currently on Amox.   Review of Systems For ROS see HPI     Objective:   Physical Exam  Constitutional: She appears well-developed and well-nourished. No distress.  HENT:  Head: Normocephalic and atraumatic.  L TM erythematous, R TM WNL Mild nasal congestion Throat w/out erythema, edema, or exudate  Eyes: Conjunctivae and EOM are normal. Pupils are equal, round, and reactive to light.  Neck: Normal range of motion. Neck supple.  Cardiovascular: Normal rate, regular rhythm, normal heart sounds and intact distal pulses.   No murmur heard. Pulmonary/Chest: Effort normal and breath sounds normal. No respiratory distress. She has no wheezes.  + hacking cough Poor air movement- improved s/p neb tx  Lymphadenopathy:    She has no cervical adenopathy.  Vitals reviewed.         Assessment & Plan:

## 2015-06-26 NOTE — Progress Notes (Signed)
Pre visit review using our clinic review tool, if applicable. No additional management support is needed unless otherwise documented below in the visit note. 

## 2015-06-26 NOTE — Assessment & Plan Note (Signed)
Deteriorated.  Pt has been seen in the ER twice in the last 4 days for cough, SOB.  Increase Qvar to 80- 2 puffs twice daily.  Extend prednisone.  Pt to finish Amox as directed.  Air movement improved s/p duoneb tx in office.  Refer to pulmonary for ongoing management.  Reviewed supportive care and red flags that should prompt return.  Pt expressed understanding and is in agreement w/ plan.

## 2015-06-26 NOTE — Patient Instructions (Signed)
Follow up as needed Start the Prednisone as directed, starting Friday (finish what the ER gave you) Finish the Amoxicillin as directed Increase the Qvar to the new 55mcg inhaler- 2 puffs twice daily Use the cough syrup as needed- will cause drowsiness Drink plenty of fluids Ibuprofen for pain REST! We'll call you with your pulmonary appt Call with any questions or concerns Hang in there! Happy Holidays!!!

## 2015-07-01 NOTE — Telephone Encounter (Signed)
Date/Time of No Show: 06/25/15 9:30am Type of Appt: acute Rescheduled: no  # NS in last year: 2 that I can see, pt was in ER 06/25/15  Charge or No Charge?

## 2015-07-02 NOTE — Telephone Encounter (Signed)
No charge as pt was in ER

## 2015-07-10 ENCOUNTER — Encounter: Payer: Medicaid Other | Admitting: Obstetrics & Gynecology

## 2015-07-22 ENCOUNTER — Encounter: Payer: Medicaid Other | Admitting: Obstetrics & Gynecology

## 2015-07-29 ENCOUNTER — Encounter: Payer: Medicaid Other | Admitting: Obstetrics & Gynecology

## 2015-07-29 ENCOUNTER — Institutional Professional Consult (permissible substitution): Payer: Medicaid Other | Admitting: Internal Medicine

## 2015-08-12 ENCOUNTER — Ambulatory Visit (INDEPENDENT_AMBULATORY_CARE_PROVIDER_SITE_OTHER): Payer: Medicaid Other | Admitting: Obstetrics & Gynecology

## 2015-08-12 ENCOUNTER — Encounter: Payer: Self-pay | Admitting: Obstetrics & Gynecology

## 2015-08-12 VITALS — BP 107/70 | HR 89 | Ht 65.0 in | Wt 255.0 lb

## 2015-08-12 DIAGNOSIS — O209 Hemorrhage in early pregnancy, unspecified: Secondary | ICD-10-CM

## 2015-08-12 LAB — POCT URINE PREGNANCY: Preg Test, Ur: POSITIVE — AB

## 2015-08-12 NOTE — Progress Notes (Signed)
Patient and mother late to appointment. Patient presents for irregular bleeding. Pregnancy test done and has positive pregnancy test.  Dr. Harolyn Rutherford placed order for patient to be seen for viability/dating ultrasound with imaging. Patient scheduled for ultrasound on 08-13-15 at 4:30 at [facility]. Patient to be scheduled for New OB appointment once dating is established. Kathrene Alu RN BSN

## 2015-08-13 ENCOUNTER — Ambulatory Visit (HOSPITAL_BASED_OUTPATIENT_CLINIC_OR_DEPARTMENT_OTHER)
Admission: RE | Admit: 2015-08-13 | Discharge: 2015-08-13 | Disposition: A | Discharge status: Home / Self Care | Payer: Medicaid Other | Source: Ambulatory Visit | Attending: Obstetrics & Gynecology | Admitting: Obstetrics & Gynecology

## 2015-08-13 DIAGNOSIS — O209 Hemorrhage in early pregnancy, unspecified: Secondary | ICD-10-CM

## 2015-08-13 DIAGNOSIS — D259 Leiomyoma of uterus, unspecified: Secondary | ICD-10-CM | POA: Insufficient documentation

## 2015-08-13 DIAGNOSIS — O3411 Maternal care for benign tumor of corpus uteri, first trimester: Secondary | ICD-10-CM | POA: Diagnosis not present

## 2015-08-14 ENCOUNTER — Telehealth: Payer: Self-pay

## 2015-08-14 NOTE — Telephone Encounter (Signed)
-----   Message from Osborne Oman, MD sent at 08/13/2015  6:00 PM EST ----- She can start prenatal care ASAP.  Thanks.

## 2015-08-14 NOTE — Telephone Encounter (Signed)
Left message for patient to come to new ob appointment Feb.2 at 10 am. Kathrene Alu RN BSN

## 2015-08-20 ENCOUNTER — Institutional Professional Consult (permissible substitution): Payer: Medicaid Other | Admitting: Internal Medicine

## 2015-08-22 ENCOUNTER — Encounter: Payer: Medicaid Other | Admitting: Family Medicine

## 2015-09-02 ENCOUNTER — Encounter (HOSPITAL_BASED_OUTPATIENT_CLINIC_OR_DEPARTMENT_OTHER): Payer: Self-pay | Admitting: *Deleted

## 2015-09-02 ENCOUNTER — Encounter: Payer: Self-pay | Admitting: Obstetrics & Gynecology

## 2015-09-02 ENCOUNTER — Ambulatory Visit (INDEPENDENT_AMBULATORY_CARE_PROVIDER_SITE_OTHER): Payer: Medicaid Other | Admitting: Obstetrics & Gynecology

## 2015-09-02 ENCOUNTER — Other Ambulatory Visit (HOSPITAL_COMMUNITY)
Admission: RE | Admit: 2015-09-02 | Discharge: 2015-09-02 | Disposition: A | Discharge status: Home / Self Care | Payer: Medicaid Other | Source: Ambulatory Visit | Attending: Obstetrics & Gynecology | Admitting: Obstetrics & Gynecology

## 2015-09-02 ENCOUNTER — Emergency Department (HOSPITAL_BASED_OUTPATIENT_CLINIC_OR_DEPARTMENT_OTHER)
Admission: EM | Admit: 2015-09-02 | Discharge: 2015-09-03 | Disposition: A | Discharge status: Home / Self Care | Payer: Medicaid Other | Attending: Emergency Medicine | Admitting: Emergency Medicine

## 2015-09-02 DIAGNOSIS — Z79899 Other long term (current) drug therapy: Secondary | ICD-10-CM | POA: Diagnosis not present

## 2015-09-02 DIAGNOSIS — Z791 Long term (current) use of non-steroidal anti-inflammatories (NSAID): Secondary | ICD-10-CM | POA: Diagnosis not present

## 2015-09-02 DIAGNOSIS — Z3400 Encounter for supervision of normal first pregnancy, unspecified trimester: Secondary | ICD-10-CM | POA: Insufficient documentation

## 2015-09-02 DIAGNOSIS — O210 Mild hyperemesis gravidarum: Secondary | ICD-10-CM | POA: Diagnosis not present

## 2015-09-02 DIAGNOSIS — Z880 Allergy status to penicillin: Secondary | ICD-10-CM | POA: Insufficient documentation

## 2015-09-02 DIAGNOSIS — Z8719 Personal history of other diseases of the digestive system: Secondary | ICD-10-CM | POA: Diagnosis not present

## 2015-09-02 DIAGNOSIS — Z3A14 14 weeks gestation of pregnancy: Secondary | ICD-10-CM | POA: Diagnosis not present

## 2015-09-02 DIAGNOSIS — O99282 Endocrine, nutritional and metabolic diseases complicating pregnancy, second trimester: Secondary | ICD-10-CM | POA: Insufficient documentation

## 2015-09-02 DIAGNOSIS — Z113 Encounter for screening for infections with a predominantly sexual mode of transmission: Secondary | ICD-10-CM | POA: Insufficient documentation

## 2015-09-02 DIAGNOSIS — Z7952 Long term (current) use of systemic steroids: Secondary | ICD-10-CM | POA: Insufficient documentation

## 2015-09-02 DIAGNOSIS — E039 Hypothyroidism, unspecified: Secondary | ICD-10-CM | POA: Diagnosis not present

## 2015-09-02 DIAGNOSIS — Z36 Encounter for antenatal screening of mother: Secondary | ICD-10-CM | POA: Diagnosis not present

## 2015-09-02 DIAGNOSIS — Z7951 Long term (current) use of inhaled steroids: Secondary | ICD-10-CM | POA: Insufficient documentation

## 2015-09-02 DIAGNOSIS — O99512 Diseases of the respiratory system complicating pregnancy, second trimester: Secondary | ICD-10-CM | POA: Diagnosis not present

## 2015-09-02 DIAGNOSIS — J45909 Unspecified asthma, uncomplicated: Secondary | ICD-10-CM | POA: Insufficient documentation

## 2015-09-02 DIAGNOSIS — O99210 Obesity complicating pregnancy, unspecified trimester: Secondary | ICD-10-CM | POA: Insufficient documentation

## 2015-09-02 DIAGNOSIS — Z792 Long term (current) use of antibiotics: Secondary | ICD-10-CM | POA: Diagnosis not present

## 2015-09-02 DIAGNOSIS — Z3401 Encounter for supervision of normal first pregnancy, first trimester: Secondary | ICD-10-CM

## 2015-09-02 DIAGNOSIS — Z862 Personal history of diseases of the blood and blood-forming organs and certain disorders involving the immune mechanism: Secondary | ICD-10-CM | POA: Diagnosis not present

## 2015-09-02 LAB — CBG MONITORING, ED: GLUCOSE-CAPILLARY: 86 mg/dL (ref 65–99)

## 2015-09-02 MED ORDER — DIPHENHYDRAMINE HCL 50 MG/ML IJ SOLN: 25.0000 mg | Freq: Once | INTRAMUSCULAR | Status: DC

## 2015-09-02 MED ORDER — LACTATED RINGERS IV BOLUS (SEPSIS)
2000.0000 mL | Freq: Once | INTRAVENOUS | Status: AC
  Administered 2015-09-02: 1000 mL via INTRAVENOUS

## 2015-09-02 MED ORDER — DEXTROSE 50 % IV SOLN
50.0000 mL | Freq: Once | INTRAVENOUS | Status: AC
  Administered 2015-09-02: 50 mL via INTRAVENOUS
  Filled 2015-09-02: qty 50

## 2015-09-02 MED ORDER — PROMETHAZINE HCL 25 MG RE SUPP: 25.0000 mg | Freq: Four times a day (QID) | RECTAL | Status: DC | PRN

## 2015-09-02 MED ORDER — PROMETHAZINE HCL 25 MG/ML IJ SOLN
25.0000 mg | Freq: Once | INTRAMUSCULAR | Status: DC
  Filled 2015-09-02: qty 1

## 2015-09-02 MED ORDER — DEXTROSE 5 % IN LACTATED RINGERS IV BOLUS: 2000.0000 mL | Freq: Once | INTRAVENOUS | Status: DC

## 2015-09-02 MED ORDER — METOCLOPRAMIDE HCL 5 MG/ML IJ SOLN: 10.0000 mg | Freq: Once | INTRAMUSCULAR | Status: DC

## 2015-09-02 MED ORDER — PROMETHAZINE HCL 25 MG/ML IJ SOLN
25.0000 mg | Freq: Once | INTRAMUSCULAR | Status: AC
  Administered 2015-09-02: 25 mg via INTRAVENOUS
  Filled 2015-09-02: qty 1

## 2015-09-02 NOTE — Progress Notes (Signed)
Subjective:    Jessica Caldwell is a SW 20 yo  G1P0 [redacted]w[redacted]d being seen today for her first obstetrical visit.  Her obstetrical history is significant for obesity and hyperemesit. Patient does not intend to breast feed. Pregnancy history fully reviewed.  Patient reports vomiting.  Filed Vitals:   09/02/15 1040  BP: 105/67  Pulse: 80  Temp: 98.1 F (36.7 C)  Weight: 230 lb (104.327 kg)    HISTORY: OB History  Gravida Para Term Preterm AB SAB TAB Ectopic Multiple Living  1             # Outcome Date GA Lbr Len/2nd Weight Sex Delivery Anes PTL Lv  1 Current              Past Medical History  Diagnosis Date  . Asthma   . Anemia   . IBS (irritable bowel syndrome)   . Hypothyroid    Past Surgical History  Procedure Laterality Date  . Knee surgery    . Video bronchoscopy Bilateral 06/29/2013    Procedure: VIDEO BRONCHOSCOPY WITHOUT FLUORO;  Surgeon: Brand Males, MD;  Location: [facility];  Service: Cardiopulmonary;  Laterality: Bilateral;  . Tonsillectomy     Family History  Problem Relation Age of Onset  . Asthma Paternal Grandfather   . Asthma Paternal Grandmother   . Heart disease Maternal Grandfather   . Heart disease Paternal Grandfather   . Heart disease Paternal Uncle   . Clotting disorder Paternal Grandmother   . Rheum arthritis Paternal Grandmother   . Cancer Paternal Grandfather     and several uncles.      Exam    Uterus:     Pelvic Exam:    Perineum: No Hemorrhoids   Vulva: normal   Vagina:  normal mucosa   pH:    Cervix: anteverted   Adnexa: normal adnexa   Bony Pelvis: android  System: Breast:  normal appearance, no masses or tenderness   Skin: normal coloration and turgor, no rashes    Neurologic: oriented   Extremities: normal strength, tone, and muscle mass   HEENT PERRLA   Mouth/Teeth mucous membranes moist, pharynx normal without lesions   Neck supple   Cardiovascular: regular rate and rhythm   Respiratory:  appears well,  vitals normal, no respiratory distress, acyanotic, normal RR, ear and throat exam is normal, neck free of mass or lymphadenopathy, chest clear, no wheezing, crepitations, rhonchi, normal symmetric air entry   Abdomen: soft, non-tender; bowel sounds normal; no masses,  no organomegaly   Urinary: urethral meatus normal      Assessment:    Pregnancy: G1P0 Patient Active Problem List   Diagnosis Date Noted  . Supervision of normal first pregnancy 09/02/2015  . Obesity in pregnancy 09/02/2015  . Dysmenorrhea 05/08/2015  . Acute ear infection 05/08/2015  . Morbid obesity ([place]) 01/16/2015  . Acute tonsillitis 07/19/2013  . Chronic cough 06/10/2013  . Moderate persistent asthma with exacerbation 05/26/2013  . Hemoptysis 05/26/2013  . Easy bruising 05/26/2013  . OM (otitis media) 05/26/2013  . Pleurisy 05/26/2013  . Acquired hypothyroidism 06/01/2011        Plan:     Initial labs drawn. Prenatal vitamins. Problem list reviewed and updated. Genetic Screening discussed Quad Screen: undecided.  Ultrasound discussed; fetal survey: requested.  Follow up in 1 weeks. With 4+ ketones and a 25 pound weight loss in the last 3 weeks, I will send her to the ER downstairs for liters of D5LR with phenergan  in the fluid. I have also prescribed phenergan supps.  RTC 1 week/prn sooner  Earnstine Meinders C. 09/02/2015

## 2015-09-02 NOTE — Discharge Instructions (Signed)
Eating Plan for Hyperemesis Gravidarum °Severe cases of hyperemesis gravidarum can lead to dehydration and malnutrition. The hyperemesis eating plan is one way to lessen the symptoms of nausea and vomiting. It is often used with prescribed medicines to control your symptoms.  °WHAT CAN I DO TO RELIEVE MY SYMPTOMS? °Listen to your body. Everyone is different and has different preferences. Find what works best for you. Some of the following things may help: °· Eat and drink slowly. °· Eat 5-6 small meals daily instead of 3 large meals.   °· Eat crackers before you get out of bed in the morning.   °· Starchy foods are usually well tolerated (such as cereal, toast, bread, potatoes, pasta, rice, and pretzels).   °· Ginger may help with nausea. Add ¼ tsp ground ginger to hot tea or choose ginger tea.   °· Try drinking 100% fruit juice or an electrolyte drink. °· Continue to take your prenatal vitamins as directed by your health care provider. If you are having trouble taking your prenatal vitamins, talk with your health care provider about different options. °· Include at least 1 serving of protein with your meals and snacks (such as meats or poultry, beans, nuts, eggs, or yogurt). Try eating a protein-rich snack before bed (such as cheese and crackers or a half turkey or peanut butter sandwich). °WHAT THINGS SHOULD I AVOID TO REDUCE MY SYMPTOMS? °The following things may help reduce your symptoms: °· Avoid foods with strong smells. Try eating meals in well-ventilated areas that are free of odors. °· Avoid drinking water or other beverages with meals. Try not to drink anything less than 30 minutes before and after meals. °· Avoid drinking more than 1 cup of fluid at a time. °· Avoid fried or high-fat foods, such as butter and cream sauces. °· Avoid spicy foods. °· Avoid skipping meals the best you can. Nausea can be more intense on an empty stomach. If you cannot tolerate food at that time, do not force it. Try sucking on  ice chips or other frozen items and make up the calories later. °· Avoid lying down within 2 hours after eating. °  °This information is not intended to replace advice given to you by your health care provider. Make sure you discuss any questions you have with your health care provider. °  °Document Released: 05/03/2007 Document Revised: 07/11/2013 Document Reviewed: 05/10/2013 °Elsevier Interactive Patient Education ©2016 [employer] ° °

## 2015-09-02 NOTE — ED Notes (Signed)
Pain in her abdomen for 14 weeks. She is [redacted] weeks pregnant. Vomiting. She was seen by her MD upstairs for same and sent here for same.

## 2015-09-02 NOTE — ED Provider Notes (Signed)
CSN: [id number]     Arrival date & time 09/02/15  1123 History   First MD Initiated Contact with Patient 09/02/15 1220     Chief Complaint  Patient presents with  . Abdominal Pain  . Emesis     (Consider location/radiation/quality/duration/timing/severity/associated sxs/prior Treatment) Patient is a 20 y.o. female presenting with abdominal pain and vomiting.  Abdominal Pain Pain location:  Generalized Pain quality: aching   Pain radiates to:  Does not radiate Pain severity:  Mild Onset quality:  Gradual Duration:  11 weeks Timing:  Constant Progression:  Unchanged Chronicity:  New Relieved by:  Nothing Worsened by:  Nothing tried Ineffective treatments:  None tried Associated symptoms: no chest pain, no chills, no dysuria, no fever, no nausea, no shortness of breath and no vomiting   Emesis Associated symptoms: abdominal pain   Associated symptoms: no arthralgias, no chills, no headaches and no myalgias    20 yo F with a chief complaint of nausea and vomiting. This been going on for the past 14 weeks. Patient is currently pregnant. Had an ultrasound on 11 weeks with an IUP. Patient denies any vaginal bleeding pelvic cramping. Saw her OB today and they were concerned for dehydration and she was sent down for IV fluids. As specifically requested that she get 2 L of LR. And Phenergan.  Past Medical History  Diagnosis Date  . Asthma   . Anemia   . IBS (irritable bowel syndrome)   . Hypothyroid    Past Surgical History  Procedure Laterality Date  . Knee surgery    . Video bronchoscopy Bilateral 06/29/2013    Procedure: VIDEO BRONCHOSCOPY WITHOUT FLUORO;  Surgeon: Brand Males, MD;  Location: [facility];  Service: Cardiopulmonary;  Laterality: Bilateral;  . Tonsillectomy     Family History  Problem Relation Age of Onset  . Asthma Paternal Grandfather   . Asthma Paternal Grandmother   . Heart disease Maternal Grandfather   . Heart disease Paternal Grandfather   .  Heart disease Paternal Uncle   . Clotting disorder Paternal Grandmother   . Rheum arthritis Paternal Grandmother   . Cancer Paternal Grandfather     and several uncles.    Social History  Substance Use Topics  . Smoking status: Never Smoker   . Smokeless tobacco: None  . Alcohol Use: No   OB History    Gravida Para Term Preterm AB TAB SAB Ectopic Multiple Living   1              Review of Systems  Constitutional: Negative for fever and chills.  HENT: Negative for congestion and rhinorrhea.   Eyes: Negative for redness and visual disturbance.  Respiratory: Negative for shortness of breath and wheezing.   Cardiovascular: Negative for chest pain and palpitations.  Gastrointestinal: Positive for abdominal pain. Negative for nausea and vomiting.  Genitourinary: Negative for dysuria and urgency.  Musculoskeletal: Negative for myalgias and arthralgias.  Skin: Negative for pallor and wound.  Neurological: Negative for dizziness and headaches.      Allergies  Azithromycin; Augmentin; Oxycodone-acetaminophen; Penicillins; Codeine; and Sulfa antibiotics  Home Medications   Prior to Admission medications   Medication Sig Start Date End Date Taking? Authorizing Provider  albuterol (PROAIR HFA) 108 (90 BASE) MCG/ACT inhaler INHALE TWO PUFFS BY MOUTH EVERY 4 HOURS AS NEEDED FOR WHEEZING OR FOR SHORTNESS OF BREATH 05/08/15   Midge Minium, MD  AMITRIPTYLINE HCL PO Take by mouth.    Historical Provider, MD  amoxicillin (AMOXIL) 500  MG capsule Take 1 capsule (500 mg total) by mouth 3 (three) times daily. 06/22/15   Tiffany Carlota Raspberry, PA-C  beclomethasone (QVAR) 80 MCG/ACT inhaler Inhale 2 puffs into the lungs 2 (two) times daily. 06/26/15   Midge Minium, MD  doxycycline (VIBRA-TABS) 100 MG tablet Take 1 tablet (100 mg total) by mouth 2 (two) times daily. 05/08/15   Midge Minium, MD  ibuprofen (ADVIL,MOTRIN) 800 MG tablet Take 1 tablet (800 mg total) by mouth 3 (three) times  daily. 03/23/15   Noemi Chapel, MD  levothyroxine (SYNTHROID, LEVOTHROID) 50 MCG tablet Take 1 tablet (50 mcg total) by mouth daily. 01/17/15   Midge Minium, MD  montelukast (SINGULAIR) 10 MG tablet Take 1 tablet (10 mg total) by mouth at bedtime. 05/08/15   Midge Minium, MD  norelgestromin-ethinyl estradiol (ORTHO EVRA) 150-35 MCG/24HR transdermal patch Place 1 patch onto the skin. Reported on 08/12/2015 10/08/14   Historical Provider, MD  pantoprazole (PROTONIX) 20 MG tablet Take 20 mg by mouth. Reported on 08/12/2015 12/29/11   Historical Provider, MD  polyethylene glycol powder (MIRALAX) powder Take 17 g by mouth daily. 04/05/15   April Palumbo, MD  predniSONE (DELTASONE) 10 MG tablet 2 tabs w/ breakfast x3 days and then 1 tab x3 days 06/26/15   Midge Minium, MD  predniSONE (DELTASONE) 20 MG tablet Take 1 tablet (20 mg total) by mouth 2 (two) times daily with a meal. 06/22/15   Delos Haring, PA-C  promethazine (PHENERGAN) 25 MG suppository Place 1 suppository (25 mg total) rectally every 6 (six) hours as needed for nausea or vomiting. 09/02/15   Emily Filbert, MD  promethazine-dextromethorphan (PROMETHAZINE-DM) 6.25-15 MG/5ML syrup Take 5 mLs by mouth 4 (four) times daily as needed. 06/26/15   Midge Minium, MD   BP 107/66 mmHg  Pulse 78  Temp(Src) 98.1 F (36.7 C) (Oral)  Resp 18  Ht 5\' 4"  (1.626 m)  Wt 230 lb (104.327 kg)  BMI 39.46 kg/m2  SpO2 100%  LMP  (LMP Unknown) Physical Exam  Constitutional: She is oriented to person, place, and time. She appears well-developed and well-nourished. No distress.  HENT:  Head: Normocephalic and atraumatic.  Eyes: EOM are normal. Pupils are equal, round, and reactive to light.  Neck: Normal range of motion. Neck supple.  Cardiovascular: Normal rate and regular rhythm.  Exam reveals no gallop and no friction rub.   No murmur heard. Pulmonary/Chest: Effort normal. She has no wheezes. She has no rales.  Abdominal: Soft. She exhibits no  distension. There is no tenderness. There is no rebound and no guarding.  Musculoskeletal: She exhibits no edema or tenderness.  Neurological: She is alert and oriented to person, place, and time.  Skin: Skin is warm and dry. She is not diaphoretic.  Psychiatric: She has a normal mood and affect. Her behavior is normal.  Nursing note and vitals reviewed.   ED Course  Procedures (including critical care time) Labs Review Labs Reviewed  CBG MONITORING, ED    Imaging Review No results found. I have personally reviewed and evaluated these images and lab results as part of my medical decision-making.   EKG Interpretation None      MDM   Final diagnoses:  Hyperemesis gravidarum    20 yo F with a chief complaint of nausea and vomiting. This been going on since she's been pregnant. His right small meals without relief. Sent down from the clinic with specific request for D5 LR and Phenergan. Discussed  with patient risks and benefits of any medicine during pregnancy. Discussed that Phenergan is category C. Patient is okay with administration. Do not have D5 LR in the ED. We'll give her 2 L of LR and a bolus of D50.  Patient was given IV fluids and Phenergan feeling much better. We'll discharge her home.  3:42 PM:  I have discussed the diagnosis/risks/treatment options with the patient and family and believe the pt to be eligible for discharge home to follow-up with OB. We also discussed returning to the ED immediately if new or worsening sx occur. We discussed the sx which are most concerning (e.g., sudden worsening pain, fever, inability to tolerate by mouth) that necessitate immediate return. Medications administered to the patient during their visit and any new prescriptions provided to the patient are listed below.  Medications given during this visit Medications  lactated ringers bolus 2,000 mL (1,000 mLs Intravenous New Bag/Given 09/02/15 1329)  dextrose 50 % solution 50 mL (50 mLs  Intravenous Given 09/02/15 1447)  promethazine (PHENERGAN) injection 25 mg (25 mg Intravenous Given 09/02/15 1329)    New Prescriptions   No medications on file    The patient appears reasonably screen and/or stabilized for discharge and I doubt any other medical condition or other [facility] requiring further screening, evaluation, or treatment in the ED at this time prior to discharge.    Deno Etienne, DO 09/02/15 1542

## 2015-09-02 NOTE — Progress Notes (Signed)
Patient complaining of morning nausea and vomits sometimes. Patient states that she has also had some head congestion and a fever of 101 F at one point. Kathrene Alu RN BSN  New ob packet given and reviewed with patient. Kathrene Alu RN BSN  Urine dip 4+ ketones. Dr. Hulan Fray spoke with [facility] ED physician and given instructions for patient to have IV with D5WLR and some phenergan. Kathrene Alu RN BSN

## 2015-09-03 ENCOUNTER — Institutional Professional Consult (permissible substitution): Payer: Medicaid Other | Admitting: Internal Medicine

## 2015-09-03 LAB — OBSTETRIC PANEL
ANTIBODY SCREEN: NEGATIVE
BASOS ABS: 0 10*3/uL (ref 0.0–0.1)
Basophils Relative: 0 % (ref 0–1)
EOS ABS: 0 10*3/uL (ref 0.0–0.7)
EOS PCT: 0 % (ref 0–5)
HCT: 35.9 % — ABNORMAL LOW (ref 36.0–46.0)
HEMOGLOBIN: 11.8 g/dL — AB (ref 12.0–15.0)
Hepatitis B Surface Ag: NEGATIVE
LYMPHS ABS: 1.1 10*3/uL (ref 0.7–4.0)
LYMPHS PCT: 33 % (ref 12–46)
MCH: 26.8 pg (ref 26.0–34.0)
MCHC: 32.9 g/dL (ref 30.0–36.0)
MCV: 81.4 fL (ref 78.0–100.0)
MONO ABS: 0.4 10*3/uL (ref 0.1–1.0)
MPV: 10.5 fL (ref 8.6–12.4)
Monocytes Relative: 13 % — ABNORMAL HIGH (ref 3–12)
Neutro Abs: 1.8 10*3/uL (ref 1.7–7.7)
Neutrophils Relative %: 54 % (ref 43–77)
PLATELETS: 178 10*3/uL (ref 150–400)
RBC: 4.41 MIL/uL (ref 3.87–5.11)
RDW: 15.8 % — ABNORMAL HIGH (ref 11.5–15.5)
RH TYPE: POSITIVE
RUBELLA: 1.61 {index} — AB (ref <0.90)
WBC: 3.3 10*3/uL — AB (ref 4.0–10.5)

## 2015-09-03 LAB — CYSTIC FIBROSIS DIAGNOSTIC STUDY

## 2015-09-03 LAB — GC/CHLAMYDIA PROBE AMP (~~LOC~~) NOT AT ARMC
Chlamydia: NEGATIVE
Neisseria Gonorrhea: NEGATIVE

## 2015-09-03 LAB — CULTURE, URINE COMPREHENSIVE: Colony Count: >=100000

## 2015-09-03 LAB — TSH: TSH: 2.41 m[IU]/L (ref 0.50–4.30)

## 2015-09-03 LAB — HIV ANTIBODY (ROUTINE TESTING W REFLEX): HIV: NONREACTIVE

## 2015-09-04 ENCOUNTER — Ambulatory Visit: Payer: Medicaid Other | Admitting: Internal Medicine

## 2015-09-09 ENCOUNTER — Encounter: Payer: Medicaid Other | Admitting: Obstetrics & Gynecology

## 2015-09-13 ENCOUNTER — Ambulatory Visit (INDEPENDENT_AMBULATORY_CARE_PROVIDER_SITE_OTHER): Payer: Medicaid Other | Admitting: Medical

## 2015-09-13 ENCOUNTER — Encounter: Payer: Self-pay | Admitting: Medical

## 2015-09-13 VITALS — BP 110/70 | HR 67 | Temp 98.3°F | Ht 65.0 in | Wt 227.2 lb

## 2015-09-13 DIAGNOSIS — Z3A14 14 weeks gestation of pregnancy: Secondary | ICD-10-CM | POA: Insufficient documentation

## 2015-09-13 DIAGNOSIS — Z8719 Personal history of other diseases of the digestive system: Secondary | ICD-10-CM | POA: Diagnosis not present

## 2015-09-13 DIAGNOSIS — Z792 Long term (current) use of antibiotics: Secondary | ICD-10-CM | POA: Insufficient documentation

## 2015-09-13 DIAGNOSIS — J45901 Unspecified asthma with (acute) exacerbation: Secondary | ICD-10-CM | POA: Insufficient documentation

## 2015-09-13 DIAGNOSIS — E039 Hypothyroidism, unspecified: Secondary | ICD-10-CM | POA: Diagnosis not present

## 2015-09-13 DIAGNOSIS — H6692 Otitis media, unspecified, left ear: Secondary | ICD-10-CM

## 2015-09-13 DIAGNOSIS — R059 Cough, unspecified: Secondary | ICD-10-CM

## 2015-09-13 DIAGNOSIS — Z880 Allergy status to penicillin: Secondary | ICD-10-CM | POA: Insufficient documentation

## 2015-09-13 DIAGNOSIS — Z791 Long term (current) use of non-steroidal anti-inflammatories (NSAID): Secondary | ICD-10-CM | POA: Insufficient documentation

## 2015-09-13 DIAGNOSIS — Z862 Personal history of diseases of the blood and blood-forming organs and certain disorders involving the immune mechanism: Secondary | ICD-10-CM | POA: Diagnosis not present

## 2015-09-13 DIAGNOSIS — O99511 Diseases of the respiratory system complicating pregnancy, first trimester: Secondary | ICD-10-CM | POA: Insufficient documentation

## 2015-09-13 DIAGNOSIS — Z7951 Long term (current) use of inhaled steroids: Secondary | ICD-10-CM | POA: Diagnosis not present

## 2015-09-13 DIAGNOSIS — O99281 Endocrine, nutritional and metabolic diseases complicating pregnancy, first trimester: Secondary | ICD-10-CM | POA: Diagnosis not present

## 2015-09-13 DIAGNOSIS — R05 Cough: Secondary | ICD-10-CM

## 2015-09-13 DIAGNOSIS — Z79899 Other long term (current) drug therapy: Secondary | ICD-10-CM | POA: Diagnosis not present

## 2015-09-13 DIAGNOSIS — J069 Acute upper respiratory infection, unspecified: Secondary | ICD-10-CM

## 2015-09-13 MED ORDER — AMOXICILLIN 500 MG PO CAPS: 500.0000 mg | ORAL_CAPSULE | Freq: Three times a day (TID) | ORAL | Status: DC

## 2015-09-13 NOTE — Progress Notes (Signed)
Pre visit review using our clinic review tool, if applicable. No additional management support is needed unless otherwise documented below in the visit note. 

## 2015-09-13 NOTE — Patient Instructions (Addendum)
Rest hydrate. Follow OB guidelines for any fever or cough.  For OM amoxicillin.  I don't think you have flu presently but you have some exposure. Treatment for flu is category C.(so not recommended). Also out of test supplies.  For wheezing continue qvar and albuterol. If you think wheezing worse notify us on Monday and could talk to your OB about prednisone use. If severe over the weekend then ED evlaluation.  Follow up in 7 days or as needed  Note by exam and her 02 sat % at 98. Decided oral prednisone not needed presently. Will get my MA to call pt and see how she is on Monday.

## 2015-09-13 NOTE — Progress Notes (Signed)
Subjective:    Patient ID: Jessica Caldwell, female    DOB: 05-04-1996, 20 y.o.   MRN: [id number]  HPI   Pt in with cough for 3 days. Pt states just dry cough. Pt thinks mild feeling hot to cold. Pt has history of asthma . Pt has been wheezing little bit. Pt has been using albuterol about every 6 hours.  Pt has some flu type. Exposure last week 2-3 family member.  Pt not using prednisone presently.  Pt is pregnant and 13 weeks.   Pt has used only amoxicillin without reaction. She can't take augmentin.  Pt OB Dr. Hulan Fray.     Review of Systems  Constitutional: Positive for fever and chills. Negative for fatigue.  HENT: Positive for congestion and sore throat. Negative for ear pain, postnasal drip, rhinorrhea and sinus pressure.        Mild st.  Respiratory: Positive for cough and wheezing. Negative for chest tightness and shortness of breath.   Cardiovascular: Negative for chest pain and palpitations.  Gastrointestinal: Negative for abdominal pain.  Genitourinary: Negative for urgency, vaginal bleeding and vaginal discharge.  Musculoskeletal: Negative for back pain.  Neurological: Negative for dizziness and headaches.  Hematological: Negative for adenopathy. Does not bruise/bleed easily.  Psychiatric/Behavioral: Negative for behavioral problems and confusion.     Past Medical History  Diagnosis Date  . Asthma   . Anemia   . IBS (irritable bowel syndrome)   . Hypothyroid     Social History   Social History  . Marital Status: Single    Spouse Name: N/A  . Number of Children: N/A  . Years of Education: N/A   Occupational History  . Not on file.   Social History Main Topics  . Smoking status: Never Smoker   . Smokeless tobacco: Not on file  . Alcohol Use: No  . Drug Use: No  . Sexual Activity: Yes    Birth Control/ Protection: None   Other Topics Concern  . Not on file   Social History Narrative    Past Surgical History  Procedure Laterality Date  .  Knee surgery    . Video bronchoscopy Bilateral 06/29/2013    Procedure: VIDEO BRONCHOSCOPY WITHOUT FLUORO;  Surgeon: Brand Males, MD;  Location: [facility];  Service: Cardiopulmonary;  Laterality: Bilateral;  . Tonsillectomy      Family History  Problem Relation Age of Onset  . Asthma Paternal Grandfather   . Asthma Paternal Grandmother   . Heart disease Maternal Grandfather   . Heart disease Paternal Grandfather   . Heart disease Paternal Uncle   . Clotting disorder Paternal Grandmother   . Rheum arthritis Paternal Grandmother   . Cancer Paternal Grandfather     and several uncles.     Allergies  Allergen Reactions  . Azithromycin Hives, Shortness Of Breath and Nausea And Vomiting  . Augmentin [Amoxicillin-Pot Clavulanate] Nausea And Vomiting and Hives  . Oxycodone-Acetaminophen Nausea And Vomiting  . Penicillins Rash    vomits  . Codeine Hives and Nausea And Vomiting  . Sulfa Antibiotics Hives and Nausea And Vomiting    Current Outpatient Prescriptions on File Prior to Visit  Medication Sig Dispense Refill  . albuterol (PROAIR HFA) 108 (90 BASE) MCG/ACT inhaler INHALE TWO PUFFS BY MOUTH EVERY 4 HOURS AS NEEDED FOR WHEEZING OR FOR SHORTNESS OF BREATH 1 Inhaler 6  . AMITRIPTYLINE HCL PO Take by mouth.    . beclomethasone (QVAR) 80 MCG/ACT inhaler Inhale 2 puffs into the lungs  2 (two) times daily. 1 Inhaler 12  . doxycycline (VIBRA-TABS) 100 MG tablet Take 1 tablet (100 mg total) by mouth 2 (two) times daily. 20 tablet 0  . ibuprofen (ADVIL,MOTRIN) 800 MG tablet Take 1 tablet (800 mg total) by mouth 3 (three) times daily. 21 tablet 0  . levothyroxine (SYNTHROID, LEVOTHROID) 50 MCG tablet Take 1 tablet (50 mcg total) by mouth daily. 30 tablet 6  . montelukast (SINGULAIR) 10 MG tablet Take 1 tablet (10 mg total) by mouth at bedtime. 30 tablet 6  . norelgestromin-ethinyl estradiol (ORTHO EVRA) 150-35 MCG/24HR transdermal patch Place 1 patch onto the skin. Reported on  08/12/2015    . pantoprazole (PROTONIX) 20 MG tablet Take 20 mg by mouth. Reported on 08/12/2015    . polyethylene glycol powder (MIRALAX) powder Take 17 g by mouth daily. 255 g 0  . predniSONE (DELTASONE) 10 MG tablet 2 tabs w/ breakfast x3 days and then 1 tab x3 days 9 tablet 0  . promethazine (PHENERGAN) 25 MG suppository Place 1 suppository (25 mg total) rectally every 6 (six) hours as needed for nausea or vomiting. 12 each 0   No current facility-administered medications on file prior to visit.    BP 110/70 mmHg  Pulse 67  Temp(Src) 98.3 F (36.8 C) (Oral)  Ht 5\' 5"  (1.651 m)  Wt 227 lb 3.2 oz (103.057 kg)  BMI 37.81 kg/m2  SpO2 98%  LMP  (LMP Unknown)       Objective:   Physical Exam  General  Mental Status - Alert. General Appearance - Well groomed. Not in acute distress.  Skin Rashes- No Rashes.  HEENT Head- Normal. Ear Auditory Canal - Left- Normal. Right - Normal.Tympanic Membrane- Left- Red. Right- Normal. Eye Sclera/Conjunctiva- Left- Normal. Right- Normal. Nose & Sinuses Nasal Mucosa- Left-  Boggy and Congested. Right-  Boggy and  Congested.Bilateral maxillary and frontal sinus pressure. Mouth & Throat Lips: Upper Lip- Normal: no dryness, cracking, pallor, cyanosis, or vesicular eruption. Lower Lip-Normal: no dryness, cracking, pallor, cyanosis or vesicular eruption. Buccal Mucosa- Bilateral- No Aphthous ulcers. Oropharynx- No Discharge or Erythema. Tonsils: Characteristics- Bilateral- No Erythema or Congestion. Size/Enlargement- Bilateral- No enlargement. Discharge- bilateral-None.  Neck Neck- Supple. No Masses.   Chest and Lung Exam Auscultation: Breath Sounds:-Clear even and unlabored. No accessory muscles used on breathing.  Cardiovascular Auscultation:Rythm- Regular, rate and rhythm. Murmurs & Other Heart Sounds:Ausculatation of the heart reveal- No Murmurs.  Lymphatic Head & Neck General Head & Neck Lymphatics: Bilateral: Description- No  Localized lymphadenopathy.       Assessment & Plan:  Rest hydrate. Follow OB guidelines for any fever or cough.  For OM amoxicillin.  I don't think you  have flu presently but you have some exposure. Treatment for flu is category C.(so not recommended). Also out of test supplies.  For wheezing continue qvar and albuterol. If you think wheezing worse notify us on Monday and could talk to your OB about prednisone use. If severe over the weekend then ED evlaluation.  Follow up in 7 days or as needed

## 2015-09-14 ENCOUNTER — Emergency Department (HOSPITAL_BASED_OUTPATIENT_CLINIC_OR_DEPARTMENT_OTHER)
Admission: EM | Admit: 2015-09-14 | Discharge: 2015-09-14 | Disposition: A | Discharge status: Home / Self Care | Payer: Medicaid Other | Attending: Emergency Medicine | Admitting: Emergency Medicine

## 2015-09-14 ENCOUNTER — Encounter (HOSPITAL_BASED_OUTPATIENT_CLINIC_OR_DEPARTMENT_OTHER): Payer: Self-pay | Admitting: Emergency Medicine

## 2015-09-14 DIAGNOSIS — J209 Acute bronchitis, unspecified: Secondary | ICD-10-CM

## 2015-09-14 MED ORDER — IPRATROPIUM-ALBUTEROL 0.5-2.5 (3) MG/3ML IN SOLN
3.0000 mL | RESPIRATORY_TRACT | Status: DC
  Administered 2015-09-14: 3 mL via RESPIRATORY_TRACT
  Filled 2015-09-14: qty 3

## 2015-09-14 MED ORDER — ALBUTEROL SULFATE (2.5 MG/3ML) 0.083% IN NEBU: 2.5000 mg | INHALATION_SOLUTION | RESPIRATORY_TRACT | Status: DC | PRN

## 2015-09-14 NOTE — ED Notes (Signed)
Pt. Has seen PMD today and is concerned about taking meds given due to being pregnant.

## 2015-09-14 NOTE — ED Notes (Signed)
Pt was seen at PCP today and diagnosed with URI and ear infection. Pt was given amoxicillin. Pt complaining of continued cough and SOB. Pt states she is [redacted] weeks pregnant and doesn't know if she can do a breathing treatment at home or take any other medications.

## 2015-09-14 NOTE — Discharge Instructions (Signed)

## 2015-09-14 NOTE — ED Provider Notes (Signed)
CSN: [id number]     Arrival date & time 09/13/15  2353 History   First MD Initiated Contact with Patient 09/14/15 0017     Chief Complaint  Patient presents with  . Cough     (Consider location/radiation/quality/duration/timing/severity/associated sxs/prior Treatment) HPI  This is a 20 year old female who is about [redacted] weeks pregnant. She is here with several days of flulike symptoms. Specifically subjective fever, body aches, malaise, nonproductive cough, chest wall soreness and nasal congestion. She was seen at an urgent care yesterday and started on amoxicillin. Yesterday evening she developed worsening shortness of breath and cough. She used her albuterol inhaler without relief. She is questioning whether albuterol is safe in pregnancy.  Past Medical History  Diagnosis Date  . Asthma   . Anemia   . IBS (irritable bowel syndrome)   . Hypothyroid   . Pregnant    Past Surgical History  Procedure Laterality Date  . Knee surgery    . Video bronchoscopy Bilateral 06/29/2013    Procedure: VIDEO BRONCHOSCOPY WITHOUT FLUORO;  Surgeon: Brand Males, MD;  Location: [facility];  Service: Cardiopulmonary;  Laterality: Bilateral;  . Tonsillectomy     Family History  Problem Relation Age of Onset  . Asthma Paternal Grandfather   . Asthma Paternal Grandmother   . Heart disease Maternal Grandfather   . Heart disease Paternal Grandfather   . Heart disease Paternal Uncle   . Clotting disorder Paternal Grandmother   . Rheum arthritis Paternal Grandmother   . Cancer Paternal Grandfather     and several uncles.    Social History  Substance Use Topics  . Smoking status: Never Smoker   . Smokeless tobacco: None  . Alcohol Use: No   OB History    Gravida Para Term Preterm AB TAB SAB Ectopic Multiple Living   1              Review of Systems  All other systems reviewed and are negative.   Allergies  Azithromycin; Augmentin; Oxycodone-acetaminophen; Penicillins; Codeine; and  Sulfa antibiotics  Home Medications   Prior to Admission medications   Medication Sig Start Date End Date Taking? Authorizing Provider  albuterol (PROAIR HFA) 108 (90 BASE) MCG/ACT inhaler INHALE TWO PUFFS BY MOUTH EVERY 4 HOURS AS NEEDED FOR WHEEZING OR FOR SHORTNESS OF BREATH 05/08/15   Midge Minium, MD  AMITRIPTYLINE HCL PO Take by mouth.    Historical Provider, MD  amoxicillin (AMOXIL) 500 MG capsule Take 1 capsule (500 mg total) by mouth 3 (three) times daily. 09/13/15   Percell Miller Saguier, PA-C  beclomethasone (QVAR) 80 MCG/ACT inhaler Inhale 2 puffs into the lungs 2 (two) times daily. 06/26/15   Midge Minium, MD  doxycycline (VIBRA-TABS) 100 MG tablet Take 1 tablet (100 mg total) by mouth 2 (two) times daily. 05/08/15   Midge Minium, MD  ibuprofen (ADVIL,MOTRIN) 800 MG tablet Take 1 tablet (800 mg total) by mouth 3 (three) times daily. 03/23/15   Noemi Chapel, MD  levothyroxine (SYNTHROID, LEVOTHROID) 50 MCG tablet Take 1 tablet (50 mcg total) by mouth daily. 01/17/15   Midge Minium, MD  montelukast (SINGULAIR) 10 MG tablet Take 1 tablet (10 mg total) by mouth at bedtime. 05/08/15   Midge Minium, MD  norelgestromin-ethinyl estradiol (ORTHO EVRA) 150-35 MCG/24HR transdermal patch Place 1 patch onto the skin. Reported on 08/12/2015 10/08/14   Historical Provider, MD  pantoprazole (PROTONIX) 20 MG tablet Take 20 mg by mouth. Reported on 08/12/2015 12/29/11   Historical  Provider, MD  polyethylene glycol powder (MIRALAX) powder Take 17 g by mouth daily. 04/05/15   April Palumbo, MD  predniSONE (DELTASONE) 10 MG tablet 2 tabs w/ breakfast x3 days and then 1 tab x3 days 06/26/15   Midge Minium, MD  promethazine (PHENERGAN) 25 MG suppository Place 1 suppository (25 mg total) rectally every 6 (six) hours as needed for nausea or vomiting. 09/02/15   Emily Filbert, MD   BP 109/67 mmHg  Pulse 88  Temp(Src) 98.6 F (37 C) (Oral)  Resp 18  SpO2 98%  LMP  (LMP Unknown)    Physical Exam  General: Well-developed, well-nourished female in no acute distress; appearance consistent with age of record HENT: normocephalic; atraumatic Eyes: pupils equal, round and reactive to light; extraocular muscles intact Neck: supple Heart: regular rate and rhythm Lungs: Decreased air movement bilaterally without frank wheezing; barky cough without stridor Abdomen: soft; nondistended; nontender; no masses or hepatosplenomegaly; bowel sounds present Extremities: No deformity; full range of motion; pulses normal Neurologic: Awake, alert and oriented; motor function intact in all extremities and symmetric; no facial droop Skin: Warm and dry Psychiatric: Normal mood and affect    ED Course  Procedures (including critical care time)   MDM  12:55 AM Air movement significantly improved after DuoNeb treatment. She is requesting a refill for albuterol for her home nebulizer.    Shanon Rosser, MD 09/14/15 [phone]

## 2015-09-17 ENCOUNTER — Ambulatory Visit (INDEPENDENT_AMBULATORY_CARE_PROVIDER_SITE_OTHER): Payer: Medicaid Other | Admitting: Family Medicine

## 2015-09-17 ENCOUNTER — Encounter: Payer: Self-pay | Admitting: Family Medicine

## 2015-09-17 VITALS — BP 122/84 | HR 84 | Temp 98.4°F | Resp 18 | Wt 227.1 lb

## 2015-09-17 DIAGNOSIS — J4541 Moderate persistent asthma with (acute) exacerbation: Secondary | ICD-10-CM | POA: Diagnosis not present

## 2015-09-17 MED ORDER — IPRATROPIUM-ALBUTEROL 0.5-2.5 (3) MG/3ML IN SOLN
3.0000 mL | Freq: Once | RESPIRATORY_TRACT | Status: AC
  Administered 2015-09-17: 3 mL via RESPIRATORY_TRACT

## 2015-09-17 MED ORDER — RANITIDINE HCL 300 MG PO TABS: 300.0000 mg | ORAL_TABLET | Freq: Every day | ORAL | Status: DC

## 2015-09-17 MED ORDER — DEXTROMETHORPHAN POLISTIREX ER 30 MG/5ML PO SUER: 30.0000 mg | Freq: Four times a day (QID) | ORAL | Status: DC | PRN

## 2015-09-17 NOTE — Progress Notes (Signed)
Pre visit review using our clinic review tool, if applicable. No additional management support is needed unless otherwise documented below in the visit note. 

## 2015-09-17 NOTE — Patient Instructions (Signed)
Follow up as needed Please call OB and let them know you have been ill Finish the amoxicillin as directed Start the Delsym every 4-6 hrs as needed for cough Drink plenty of fluids Continue the Qvar and the albuterol REST! Start the Zantac nightly to decrease reflux and help prevent cough Call with any questions or concerns Hang in there!!!

## 2015-09-17 NOTE — Progress Notes (Signed)
   Subjective:    Patient ID: Jessica Caldwell, female    DOB: [birth year], 20 y.o.   MRN: [id number]  HPI URI- pt was seen on 2/24 and started on Amoxicillin.  Went to ER on 2/25 for worsening cough and SOB.  Pt reports duoneb in ER improved her sxs.  Pt reports + fatigue, dry cough, chest discomfort from coughing.  Pt reports last temp was 2 days ago- '100 or 101'.  Denies sinus pain/pressure.  Using Qvar and albuterol.  Is currently [redacted] weeks pregnant.   Review of Systems For ROS see HPI     Objective:   Physical Exam  Constitutional: She appears well-developed and well-nourished. No distress.  HENT:  Head: Normocephalic and atraumatic.  TMs dull and erythematous bilaterally Mild nasal congestion Throat w/out erythema, edema, or exudate  Eyes: Conjunctivae and EOM are normal. Pupils are equal, round, and reactive to light.  Neck: Normal range of motion. Neck supple.  Cardiovascular: Normal rate, regular rhythm, normal heart sounds and intact distal pulses.   No murmur heard. Pulmonary/Chest: Effort normal and breath sounds normal. No respiratory distress. She has no wheezes.  + hacking cough  Lymphadenopathy:    She has no cervical adenopathy.  Vitals reviewed.         Assessment & Plan:

## 2015-09-17 NOTE — Assessment & Plan Note (Signed)
Pt's ear infection is improving but she continues to have hard, hacking cough.  Lungs themselves are clear- no wheezing or crackles.  Cough improved s/p duoneb tx in office.  Due to pregnancy, will avoid prednisone.  Start Delsym for cough, Zantac in case of reflux component.  Pt to continue Qvar and albuterol.  She is to call OB and let them know of her illness.  Reviewed supportive care and red flags that should prompt return.  Pt expressed understanding and is in agreement w/ plan.

## 2015-09-19 ENCOUNTER — Ambulatory Visit (INDEPENDENT_AMBULATORY_CARE_PROVIDER_SITE_OTHER): Payer: Medicaid Other | Admitting: Family Medicine

## 2015-09-19 VITALS — BP 112/67 | HR 74 | Wt 227.0 lb

## 2015-09-19 DIAGNOSIS — E039 Hypothyroidism, unspecified: Secondary | ICD-10-CM

## 2015-09-19 DIAGNOSIS — Z3402 Encounter for supervision of normal first pregnancy, second trimester: Secondary | ICD-10-CM

## 2015-09-19 DIAGNOSIS — J4541 Moderate persistent asthma with (acute) exacerbation: Secondary | ICD-10-CM

## 2015-09-19 MED ORDER — PREDNISONE 10 MG PO TABS: ORAL_TABLET | ORAL | Status: DC

## 2015-09-19 NOTE — Progress Notes (Signed)
Subjective:  Jessica Caldwell is a 20 y.o. G1P0 at [redacted]w[redacted]d being seen today for ongoing prenatal care.  She is currently monitored for the following issues for this high-risk pregnancy and has Moderate persistent asthma with exacerbation; Hemoptysis; Easy bruising; OM (otitis media); Pleurisy; Chronic cough; Acute tonsillitis; Acquired hypothyroidism; Morbid obesity ([place]); Dysmenorrhea; Acute ear infection; Supervision of normal first pregnancy; and Obesity in pregnancy on her problem list.  Patient reports nonproductive cough, worse at night.  Her PCP saw her two days ago, who recommended continuing Qvar and albuterol.  Also recommended delsym.  PCP wanted to start her on steroids, but wasn't sure if it was safe in pregnancy.  Contractions: Not present. Vag. Bleeding: None.   . Denies leaking of fluid.   Is taking synthroid 24mcg daily first thing in the morning.   The following portions of the patient's history were reviewed and updated as appropriate: allergies, current medications, past family history, past medical history, past social history, past surgical history and problem list. Problem list updated.  Objective:   Filed Vitals:   09/19/15 0939  BP: 112/67  Pulse: 74  Weight: 227 lb (102.967 kg)    Fetal Status:           General:  Alert, oriented and cooperative. Patient is in no acute distress.  Skin: Skin is warm and dry. No rash noted.   Cardiovascular: Normal heart rate noted  Respiratory: Normal respiratory effort, no problems with respiration noted.  CTA bilaterally - no wheezing, rales, rhonchi.    Abdomen: Soft, gravid, appropriate for gestational age. Pain/Pressure: Absent     Pelvic: Vag. Bleeding: None Vag D/C Character: Thin   Cervical exam deferred        Extremities: Normal range of motion.  Edema: None  Mental Status: Normal mood and affect. Normal behavior. Normal judgment and thought content.   Urinalysis: Urine Protein: Negative Urine Glucose:  Negative  Assessment and Plan:  Pregnancy: G1P0 at [redacted]w[redacted]d  1. Encounter for supervision of normal first pregnancy in second trimester FHT normal.  Will obtain US for fetal survey  2. Moderate persistent asthma with exacerbation I called her PCP, who is more aware of her asthma history.  I discussed with Dr Birdie Riddle that her lungs remained clear, but that predisone can be used in pregnancy for short courses.  Dr Birdie Riddle stated that she would like the patient to be on steroids for her asthma - will prescribe Prednisone 30mg  x 3 days, 20mg  x 3 days, 10mg  x 3 days.  Recommended expectorant.  List of OTC meds safe in pregnancy given to patient.  3. Acquired hypothyroidism Will need TSH rechecked with 28 week labs. Continue synthroid.   Preterm labor symptoms and general obstetric precautions including but not limited to vaginal bleeding, contractions, leaking of fluid and fetal movement were reviewed in detail with the patient. Please refer to After Visit Summary for other counseling recommendations.  No Follow-up on file.   Truett Mainland, DO

## 2015-09-19 NOTE — Patient Instructions (Signed)

## 2015-09-26 ENCOUNTER — Telehealth: Payer: Self-pay | Admitting: Family Medicine

## 2015-09-26 DIAGNOSIS — R053 Chronic cough: Secondary | ICD-10-CM

## 2015-09-26 DIAGNOSIS — R05 Cough: Secondary | ICD-10-CM

## 2015-09-26 DIAGNOSIS — J4541 Moderate persistent asthma with (acute) exacerbation: Secondary | ICD-10-CM

## 2015-09-26 NOTE — Telephone Encounter (Signed)
Pt was already started on Prednisone by OB which would have been my next step.  Make sure she is using her allergy medication- singulair, Flonase, Claritin/Zyrtec.  At this point, I would say she needs to see a pulmonologist if her cough and shortness of breath are still an issue.  The cough will take weeks to improve and the fatigue may be pregnancy related but if the family is concerned about lack of improvement, we can refer

## 2015-09-26 NOTE — Telephone Encounter (Signed)
Referral placed.

## 2015-09-26 NOTE — Telephone Encounter (Signed)
Caller name:Tracie  Relation to pt:mom Call back [place]:  Reason for call: mom would like for you to call her regarding the pt, states pt was last week and has not gotten any better, pt still has congestion, coughing, no energy, advised pt to make appt however mom states she wants to speak with you first to see if there is any thing else that can be done since pt is pregnant.

## 2015-10-01 ENCOUNTER — Encounter: Payer: Self-pay | Admitting: Internal Medicine

## 2015-10-01 ENCOUNTER — Other Ambulatory Visit: Payer: Self-pay | Admitting: Internal Medicine

## 2015-10-01 ENCOUNTER — Institutional Professional Consult (permissible substitution): Payer: Medicaid Other | Admitting: Pulmonary Disease

## 2015-10-01 ENCOUNTER — Ambulatory Visit (INDEPENDENT_AMBULATORY_CARE_PROVIDER_SITE_OTHER): Payer: Medicaid Other | Admitting: Internal Medicine

## 2015-10-01 VITALS — BP 122/76 | HR 89 | Ht 65.0 in | Wt 230.0 lb

## 2015-10-01 DIAGNOSIS — R05 Cough: Secondary | ICD-10-CM | POA: Insufficient documentation

## 2015-10-01 DIAGNOSIS — Z8709 Personal history of other diseases of the respiratory system: Secondary | ICD-10-CM

## 2015-10-01 DIAGNOSIS — R059 Cough, unspecified: Secondary | ICD-10-CM

## 2015-10-01 MED ORDER — BUDESONIDE 90 MCG/ACT IN AEPB: 2.0000 | INHALATION_SPRAY | Freq: Two times a day (BID) | RESPIRATORY_TRACT | Status: DC

## 2015-10-01 NOTE — Progress Notes (Signed)
Subjective:     Patient ID: Jessica Caldwell, female   DOB: September 26, 1995, 20 y.o.   MRN: [id number]  HPI  OV 10/01/2015  Chief Complaint  Patient presents with  . Follow-up    Pt last seen in 06/2013. Dr. Birdie Riddle requested pt come back to be re-evaluated due to pt currently pregnant. Pt c/o DOE worsens when taking a deep breath and c/o chest tightness when SOB. Pt c/o cough with little mucus production - clear in color.    20 year old obese female. Last seen over 3 years ago. At the time she presented with her mom. She had a bit background baseline history of asthma not otherwise specified. But the reason I saw her was for hemoptysis. We did an extensive evaluation including bronchoscopy and ended up being negative. After that she was lost to follow-up. Now the mom tells me that they did see ENT upon my request and it was deemed that the tonsils were responsible for "hemoptysis". She underwent tonsillectomy and the problem resolved. They also did see allergy specialist but the details are not known. Apparently allergy testing was positive for "animals". But overall she's been doing well in the past 3 years maintained on Qvar and Singulair which she says she diligently takes. [place] mom [place] to reconcile with her dad but now they moved back to [place]. Most recently in the last 3 months she's pregnant. She is entering the second trimester pregnancy. Then approximately 1-2 weeks ago had a cold viral infection and since then having significant amount of cough. Using albuterol multiple times for rescue. Did a 5 day prednisone course and did not help. In the context of pregnancy and cough and significant albuterol rescue use she has been rereferred.  History is given by her and her mom  Exhaled nitric oxide today in the office is normal speaking against active eosinophilic asthma   has a past medical history of Asthma; Anemia; IBS (irritable bowel syndrome); Hypothyroid; and  Pregnant.   reports that she has never smoked. She does not have any smokeless tobacco history on file.  Past Surgical History  Procedure Laterality Date  . Knee surgery    . Video bronchoscopy Bilateral 06/29/2013    Procedure: VIDEO BRONCHOSCOPY WITHOUT FLUORO;  Surgeon: Brand Males, MD;  Location: [facility];  Service: Cardiopulmonary;  Laterality: Bilateral;  . Tonsillectomy      Allergies  Allergen Reactions  . Azithromycin Hives, Shortness Of Breath and Nausea And Vomiting  . Augmentin [Amoxicillin-Pot Clavulanate] Nausea And Vomiting and Hives  . Oxycodone-Acetaminophen Nausea And Vomiting  . Penicillins Rash    vomits  . Codeine Hives and Nausea And Vomiting  . Sulfa Antibiotics Hives and Nausea And Vomiting    Immunization History  Administered Date(s) Administered  . Pneumococcal Polysaccharide-23 07/20/2010    Family History  Problem Relation Age of Onset  . Asthma Paternal Grandfather   . Asthma Paternal Grandmother   . Heart disease Maternal Grandfather   . Heart disease Paternal Grandfather   . Heart disease Paternal Uncle   . Clotting disorder Paternal Grandmother   . Rheum arthritis Paternal Grandmother   . Cancer Paternal Grandfather     and several uncles.      Current outpatient prescriptions:  .  albuterol (PROAIR HFA) 108 (90 BASE) MCG/ACT inhaler, INHALE TWO PUFFS BY MOUTH EVERY 4 HOURS AS NEEDED FOR WHEEZING OR FOR SHORTNESS OF BREATH, Disp: 1 Inhaler, Rfl: 6 .  albuterol (PROVENTIL) (  2.5 MG/3ML) 0.083% nebulizer solution, Take 3-6 mLs (2.5-5 mg total) by nebulization every 4 (four) hours as needed for wheezing or shortness of breath., Disp: 30 vial, Rfl: 0 .  beclomethasone (QVAR) 80 MCG/ACT inhaler, Inhale 2 puffs into the lungs 2 (two) times daily., Disp: 1 Inhaler, Rfl: 12 .  dextromethorphan (DELSYM) 30 MG/5ML liquid, Take 5 mLs (30 mg total) by mouth every 6 (six) hours as needed for cough., Disp: 148 mL, Rfl: 1 .  levothyroxine  (SYNTHROID, LEVOTHROID) 50 MCG tablet, Take 1 tablet (50 mcg total) by mouth daily., Disp: 30 tablet, Rfl: 6 .  montelukast (SINGULAIR) 10 MG tablet, Take 1 tablet (10 mg total) by mouth at bedtime., Disp: 30 tablet, Rfl: 6 .  pantoprazole (PROTONIX) 20 MG tablet, Take 20 mg by mouth. Reported on 08/12/2015, Disp: , Rfl:  .  promethazine (PHENERGAN) 25 MG suppository, Place 1 suppository (25 mg total) rectally every 6 (six) hours as needed for nausea or vomiting., Disp: 12 each, Rfl: 0 .  ranitidine (ZANTAC) 300 MG tablet, Take 1 tablet (300 mg total) by mouth at bedtime., Disp: 30 tablet, Rfl: 3     Review of Systems     Objective:   Physical Exam  Constitutional: She is oriented to person, place, and time. She appears well-developed and well-nourished. No distress.  HENT:  Head: Normocephalic and atraumatic.  Right Ear: External ear normal.  Left Ear: External ear normal.  Mouth/Throat: Oropharynx is clear and moist. No oropharyngeal exudate.  Eyes: Conjunctivae and EOM are normal. Pupils are equal, round, and reactive to light. Right eye exhibits no discharge. Left eye exhibits no discharge. No scleral icterus.  Neck: Normal range of motion. Neck supple. No JVD present. No tracheal deviation present. No thyromegaly present.  Cardiovascular: Normal rate, regular rhythm, normal heart sounds and intact distal pulses.  Exam reveals no gallop and no friction rub.   No murmur heard. Pulmonary/Chest: Effort normal and breath sounds normal. No respiratory distress. She has no wheezes. She has no rales. She exhibits no tenderness.  Abdominal: Soft. Bowel sounds are normal. She exhibits no distension and no mass. There is no tenderness. There is no rebound and no guarding.  Musculoskeletal: Normal range of motion. She exhibits no edema or tenderness.  Lymphadenopathy:    She has no cervical adenopathy.  Neurological: She is alert and oriented to person, place, and time. She has normal reflexes.  No cranial nerve deficit. She exhibits normal muscle tone. Coordination normal.  Skin: Skin is warm and dry. No rash noted. She is not diaphoretic. No erythema. No pallor.  Psychiatric: She has a normal mood and affect. Her behavior is normal. Judgment and thought content normal.  Vitals reviewed.   Filed Vitals:   10/01/15 1527  BP: 122/76  Pulse: 89  Height: 5\' 5"  (1.651 m)  Weight: 230 lb (104.327 kg)  SpO2: 98%   Estimated body mass index is 38.27 kg/(m^2) as calculated from the following:   Height as of this encounter: 5\' 5"  (1.651 m).   Weight as of this encounter: 230 lb (104.327 kg).      Assessment:       ICD-9-CM ICD-10-CM   1. History of asthma V12.69 Z87.09   2. Cough 786.2 R05        Plan:      At this point I think this is postviral reactive cough; time and patience the best treatment for this I do not think it is active asthma at this  point Possible acid reflux made worse by pregnancy could be contributing  Plan - You can do albuterol as needed but be careful about how many times to use it - not more than 4 times a day -- Change Qvar to Pulmicort 2 puff 2 times daily during pregnancy - Okay for Singulair at this point but if you're doing better with her asthma we can consider stopping it during pregnancy  - We'll decide this at follow-up - Please talk to primary care physician Dr. Annye Asa, MD about potential acid reflux treatment: TUMS likely the best option  Follow-up - 4 weeks course with me or my nurse practitioner   Dr. Brand Males, M.D., [facility].C.P Pulmonary and Critical Care Medicine Staff Physician [facility] Pager: [phone], If no answer or between  15:00h - 7:00h: call [phone]  10/01/2015 4:11 PM

## 2015-10-01 NOTE — Patient Instructions (Addendum)
ICD-9-CM ICD-10-CM   1. History of asthma V12.69 Z87.09   2. Cough 786.2 R05     At this point I think this is postviral reactive cough; time and patience the best treatment for this I do not think it is active asthma at this point Possible acid reflux made worse by pregnancy could be contributing  Plan - You can do albuterol as needed but be careful about how many times to use it - not more than 4 times a day -- Change Qvar to Pulmicort 2 puff 2 times daily during pregnancy - Okay for Singulair at this point but if you're doing better with her asthma we can consider stopping it during pregnancy  - We'll decide this at follow-up - Please talk to primary care physician Dr. Annye Asa, MD about potential acid reflux treatment: TUMS likely the best option  Follow-up - 4 weeks course with me or my nurse practitioner

## 2015-10-02 LAB — NITRIC OXIDE: Nitric Oxide: 5

## 2015-10-02 NOTE — Addendum Note (Signed)
Addended by: Collier Salina on: 10/02/2015 09:17 AM   Modules accepted: Orders

## 2015-10-03 ENCOUNTER — Ambulatory Visit (HOSPITAL_COMMUNITY)
Admission: RE | Admit: 2015-10-03 | Discharge: 2015-10-03 | Disposition: A | Discharge status: Home / Self Care | Payer: Medicaid Other | Source: Ambulatory Visit | Attending: Family Medicine | Admitting: Family Medicine

## 2015-10-03 DIAGNOSIS — Z36 Encounter for antenatal screening of mother: Secondary | ICD-10-CM | POA: Diagnosis present

## 2015-10-03 DIAGNOSIS — Z3A18 18 weeks gestation of pregnancy: Secondary | ICD-10-CM | POA: Diagnosis not present

## 2015-10-03 DIAGNOSIS — Z3402 Encounter for supervision of normal first pregnancy, second trimester: Secondary | ICD-10-CM

## 2015-10-03 DIAGNOSIS — E039 Hypothyroidism, unspecified: Secondary | ICD-10-CM | POA: Diagnosis not present

## 2015-10-03 DIAGNOSIS — O99212 Obesity complicating pregnancy, second trimester: Secondary | ICD-10-CM | POA: Insufficient documentation

## 2015-10-03 DIAGNOSIS — O99282 Endocrine, nutritional and metabolic diseases complicating pregnancy, second trimester: Secondary | ICD-10-CM | POA: Diagnosis not present

## 2015-10-16 ENCOUNTER — Telehealth: Payer: Self-pay

## 2015-10-16 ENCOUNTER — Ambulatory Visit: Payer: Medicaid Other | Admitting: Family Medicine

## 2015-10-16 ENCOUNTER — Telehealth: Payer: Self-pay | Admitting: Family Medicine

## 2015-10-16 NOTE — Telephone Encounter (Signed)
Patient called the office and left message with receptionist that she is sick. Attempted to return call to patient and left message for patient to return call to office. Kathrene Alu RN BSN

## 2015-10-16 NOTE — Telephone Encounter (Signed)
Patients mother called me back and states that she called [facility] about the patient having an ongoing cough with her asthmas and patients mother states that [facility] told her to come have the baby checked out. Patient made aware that we will want input from [facility] on the patients asthma and her thyroid problems so they she may need to be seen there as well.  Patient had appointment with [facility] this morning 10-16-15 at 915 and when I asked the patients mother about the appointment she said that she overslept and then I proceeded to ask when she talked with [facility] she said she didn't talk with anyone from there this morning.   Patients mother made aware we don't have any available appointments this afternoon and if patient is short of breathe she needs to be evaluated at urgent care or emergency room. Patients mother states understanding. Kathrene Alu RN BSN

## 2015-10-17 ENCOUNTER — Encounter: Payer: Self-pay | Admitting: Family Medicine

## 2015-10-17 ENCOUNTER — Ambulatory Visit (INDEPENDENT_AMBULATORY_CARE_PROVIDER_SITE_OTHER): Payer: Medicaid Other | Admitting: Obstetrics & Gynecology

## 2015-10-17 ENCOUNTER — Encounter: Payer: Self-pay | Admitting: General Practice

## 2015-10-17 ENCOUNTER — Encounter: Payer: Medicaid Other | Admitting: Obstetrics & Gynecology

## 2015-10-17 DIAGNOSIS — Z3402 Encounter for supervision of normal first pregnancy, second trimester: Secondary | ICD-10-CM

## 2015-10-17 DIAGNOSIS — R5382 Chronic fatigue, unspecified: Secondary | ICD-10-CM | POA: Diagnosis not present

## 2015-10-17 NOTE — Telephone Encounter (Signed)
Dismissal paperwork started, placed on Manager's desk.

## 2015-10-17 NOTE — Progress Notes (Signed)
Patient states that she has had some morning sickness. Patient states she has had a cough for about a couple of weeks. Kathrene Alu RN BSN

## 2015-10-17 NOTE — Progress Notes (Signed)
Subjective:  Jessica Caldwell is a 20 y.o.  SW G1P0 (daughter) at [redacted]w[redacted]d being seen today for ongoing prenatal care.  She is currently monitored for the following issues for this high-risk pregnancy and has Moderate persistent asthma with exacerbation; Hemoptysis; Easy bruising; OM (otitis media); Pleurisy; Chronic cough; Acute tonsillitis; Acquired hypothyroidism; Morbid obesity ([place]); Dysmenorrhea; Acute ear infection; Supervision of normal first pregnancy; Obesity in pregnancy; History of asthma; and Cough on her problem list.  Patient reports She feels a lot of fatigue and feels like breathing is difficult..  Contractions: Not present. Vag. Bleeding: None.   . Denies leaking of fluid.   The following portions of the patient's history were reviewed and updated as appropriate: allergies, current medications, past family history, past medical history, past social history, past surgical history and problem list. Problem list updated.  Objective:   Filed Vitals:   10/17/15 1013  BP: 106/64  Pulse: 75  Weight: 230 lb (104.327 kg)    Fetal Status: Fetal Heart Rate (bpm): 145         General:  Alert, oriented and cooperative. Patient is in no acute distress.  Skin: Skin is warm and dry. No rash noted.   Cardiovascular: Normal heart rate noted  Respiratory: Normal respiratory effort, no problems with respiration noted  Abdomen: Soft, gravid, appropriate for gestational age. Pain/Pressure: Absent     Pelvic: Vag. Bleeding: None Vag D/C Character: Thin   Cervical exam deferred        Extremities: Normal range of motion.  Edema: None  Mental Status: Normal mood and affect. Normal behavior. Normal judgment and thought content.   Lungs- CTAB, good air movement, no wheezes Urinalysis: Urine Protein: Negative Urine Glucose: Negative  Assessment and Plan:  Pregnancy: G1P0 at [redacted]w[redacted]d  1. Morbid obesity due to excess calories ([place])   2. Encounter for supervision of normal first pregnancy in second  trimester 3. Difficulty breathing -rec antihistamine daily 4. Fatigue- she has thyroid disease and I will recheck a TSH  Preterm labor symptoms and general obstetric precautions including but not limited to vaginal bleeding, contractions, leaking of fluid and fetal movement were reviewed in detail with the patient. Please refer to After Visit Summary for other counseling recommendations.  Return in about 1 week (around 10/24/2015) for to see Dr. Nehemiah Settle.   Emily Filbert, MD

## 2015-10-17 NOTE — Telephone Encounter (Signed)
Pt was no show 10/16/15 9:30am acute appt, pt has not rescheduled, 4th no show since 03/08/15, charge or no charge?

## 2015-10-17 NOTE — Telephone Encounter (Signed)
Marked to charge - mailing no show letter

## 2015-10-17 NOTE — Telephone Encounter (Signed)
Yes charge- and we will begin dismissal process

## 2015-10-18 LAB — TSH: TSH: 2.56 mIU/L (ref 0.50–4.30)

## 2015-10-22 ENCOUNTER — Telehealth: Payer: Self-pay | Admitting: Family Medicine

## 2015-10-22 NOTE — Telephone Encounter (Signed)
Patient dismissed from [facility] by Annye Asa MD, effective October 17, 2015. Dismissal letter sent out by certified / registered mail. DAJ  Received signed domestic return receipt verifying delivery of certified letter on October 26, 2015. Article number [id number] [place]  DAJ

## 2015-10-23 ENCOUNTER — Encounter: Payer: Medicaid Other | Admitting: Family Medicine

## 2015-10-24 ENCOUNTER — Encounter: Payer: Medicaid Other | Admitting: Family Medicine

## 2015-10-31 ENCOUNTER — Ambulatory Visit: Payer: Medicaid Other | Admitting: Adult Health

## 2015-11-07 ENCOUNTER — Ambulatory Visit (INDEPENDENT_AMBULATORY_CARE_PROVIDER_SITE_OTHER): Payer: Medicaid Other | Admitting: Adult Health

## 2015-11-07 ENCOUNTER — Ambulatory Visit (INDEPENDENT_AMBULATORY_CARE_PROVIDER_SITE_OTHER): Payer: Self-pay | Admitting: Obstetrics & Gynecology

## 2015-11-07 ENCOUNTER — Encounter: Payer: Self-pay | Admitting: Adult Health

## 2015-11-07 ENCOUNTER — Encounter: Payer: Self-pay | Admitting: Obstetrics & Gynecology

## 2015-11-07 VITALS — BP 107/75 | HR 91 | Wt 231.0 lb

## 2015-11-07 VITALS — BP 114/74 | HR 95 | Temp 98.3°F | Ht 65.0 in | Wt 232.0 lb

## 2015-11-07 DIAGNOSIS — H6501 Acute serous otitis media, right ear: Secondary | ICD-10-CM

## 2015-11-07 DIAGNOSIS — Z3402 Encounter for supervision of normal first pregnancy, second trimester: Secondary | ICD-10-CM

## 2015-11-07 DIAGNOSIS — J4541 Moderate persistent asthma with (acute) exacerbation: Secondary | ICD-10-CM

## 2015-11-07 DIAGNOSIS — O99210 Obesity complicating pregnancy, unspecified trimester: Secondary | ICD-10-CM

## 2015-11-07 MED ORDER — CEFUROXIME AXETIL 500 MG PO TABS: 500.0000 mg | ORAL_TABLET | Freq: Two times a day (BID) | ORAL | Status: AC

## 2015-11-07 NOTE — Assessment & Plan Note (Addendum)
Right sided OM  Plan  Ceftin 500mg  Twice daily  For 7 days .  Continue on Pulmicort 2 puffs Twice daily  , rinse after use.  Follow up with Dr. Chase Caller in 2-3 months and As needed   Please contact office for sooner follow up if symptoms do not improve or worsen or seek emergency care

## 2015-11-07 NOTE — Patient Instructions (Signed)
Asthma Attack Prevention While you may not be able to control the fact that you have asthma, you can take actions to prevent asthma attacks. The best way to prevent asthma attacks is to maintain good control of your asthma. You can achieve this by:  Taking your medicines as directed.  Avoiding things that can irritate your airways or make your asthma symptoms worse (asthma triggers).  Keeping track of how well your asthma is controlled and of any changes in your symptoms.  Responding quickly to worsening asthma symptoms (asthma attack).  Seeking emergency care when it is needed. WHAT ARE SOME WAYS TO PREVENT AN ASTHMA ATTACK? Have a Plan Work with your health care provider to create a written plan for managing and treating your asthma attacks (asthma action plan). This plan includes:  A list of your asthma triggers and how you can avoid them.  Information on when medicines should be taken and when their dosages should be changed.  The use of a device that measures how well your lungs are working (peak flow meter). Monitor Your Asthma Use your peak flow meter and record your results in a journal every day. A drop in your peak flow numbers on one or more days may indicate the start of an asthma attack. This can happen even before you start to feel symptoms. You can prevent an asthma attack from getting worse by following the steps in your asthma action plan. Avoid Asthma Triggers Work with your asthma health care provider to find out what your asthma triggers are. This can be done by:  Allergy testing.  Keeping a journal that notes when asthma attacks occur and the factors that may have contributed to them.  Determining if there are other medical conditions that are making your asthma worse. Once you have determined your asthma triggers, take steps to avoid them. This may include avoiding excessive or prolonged exposure to:  Dust. Have someone dust and vacuum your home for you once or  twice a week. Using a high-efficiency particulate arrestance (HEPA) vacuum is best.  Smoke. This includes campfire smoke, forest fire smoke, and secondhand smoke from tobacco products.  Pet dander. Avoid contact with animals that you know you are allergic to.  Allergens from trees, grasses or pollens. Avoid spending a lot of time outdoors when pollen counts are high, and on very windy days.  Very cold, dry, or humid air.  Mold.  Foods that contain high amounts of sulfites.  Strong odors.  Outdoor air pollutants, such as engine exhaust.  Indoor air pollutants, such as aerosol sprays and fumes from household cleaners.  Household pests, including dust mites and cockroaches, and pest droppings.  Certain medicines, including NSAIDs. Always talk to your health care provider before stopping or starting any new medicines. Medicines Take over-the-counter and prescription medicines only as told by your health care provider. Many asthma attacks can be prevented by carefully following your medicine schedule. Taking your medicines correctly is especially important when you cannot avoid certain asthma triggers. Act Quickly If an asthma attack does happen, acting quickly can decrease how severe it is and how long it lasts. Take these steps:   Pay attention to your symptoms. If you are coughing, wheezing, or having difficulty breathing, do not wait to see if your symptoms go away on their own. Follow your asthma action plan.  If you have followed your asthma action plan and your symptoms are not improving, call your health care provider or seek immediate medical care   at the nearest hospital. It is important to note how often you need to use your fast-acting rescue inhaler. If you are using your rescue inhaler more often, it may mean that your asthma is not under control. Adjusting your asthma treatment plan may help you to prevent future asthma attacks and help you to gain better control of your  condition. HOW CAN I PREVENT AN ASTHMA ATTACK WHEN I EXERCISE? Follow advice from your health care provider about whether you should use your fast-acting inhaler before exercising. Many people with asthma experience exercise-induced bronchoconstriction (EIB). This condition often worsens during vigorous exercise in cold, humid, or dry environments. Usually, people with EIB can stay very active by pre-treating with a fast-acting inhaler before exercising.   This information is not intended to replace advice given to you by your health care provider. Make sure you discuss any questions you have with your health care provider.   Document Released: 06/24/2009 Document Revised: 03/27/2015 Document Reviewed: 12/06/2014 Elsevier Interactive Patient Education 2016 [place] of Pregnancy The second trimester is from week 13 through week 28, months 4 through 6. The second trimester is often a time when you feel your best. Your body has also adjusted to being pregnant, and you begin to feel better physically. Usually, morning sickness has lessened or quit completely, you may have more energy, and you may have an increase in appetite. The second trimester is also a time when the fetus is growing rapidly. At the end of the sixth month, the fetus is about 9 inches long and weighs about 1 pounds. You will likely begin to feel the baby move (quickening) between 18 and 20 weeks of the pregnancy. BODY CHANGES Your body goes through many changes during pregnancy. The changes vary from woman to woman.   Your weight will continue to increase. You will notice your lower abdomen bulging out.  You may begin to get stretch marks on your hips, abdomen, and breasts.  You may develop headaches that can be relieved by medicines approved by your health care provider.  You may urinate more often because the fetus is pressing on your bladder.  You may develop or continue to have heartburn as a result of  your pregnancy.  You may develop constipation because certain hormones are causing the muscles that push waste through your intestines to slow down.  You may develop hemorrhoids or swollen, bulging veins (varicose veins).  You may have back pain because of the weight gain and pregnancy hormones relaxing your joints between the bones in your pelvis and as a result of a shift in weight and the muscles that support your balance.  Your breasts will continue to grow and be tender.  Your gums may bleed and may be sensitive to brushing and flossing.  Dark spots or blotches (chloasma, mask of pregnancy) may develop on your face. This will likely fade after the baby is born.  A dark line from your belly button to the pubic area (linea nigra) may appear. This will likely fade after the baby is born.  You may have changes in your hair. These can include thickening of your hair, rapid growth, and changes in texture. Some women also have hair loss during or after pregnancy, or hair that feels dry or thin. Your hair will most likely return to normal after your baby is born. WHAT TO EXPECT AT YOUR PRENATAL VISITS During a routine prenatal visit:  You will be weighed to make sure you and  the fetus are growing normally.  Your blood pressure will be taken.  Your abdomen will be measured to track your baby's growth.  The fetal heartbeat will be listened to.  Any test results from the previous visit will be discussed. Your health care provider may ask you:  How you are feeling.  If you are feeling the baby move.  If you have had any abnormal symptoms, such as leaking fluid, bleeding, severe headaches, or abdominal cramping.  If you are using any tobacco products, including cigarettes, chewing tobacco, and electronic cigarettes.  If you have any questions. Other tests that may be performed during your second trimester include:  Blood tests that check for:  Low iron levels  (anemia).  Gestational diabetes (between 24 and 28 weeks).  Rh antibodies.  Urine tests to check for infections, diabetes, or protein in the urine.  An ultrasound to confirm the proper growth and development of the baby.  An amniocentesis to check for possible genetic problems.  Fetal screens for spina bifida and Down syndrome.  HIV (human immunodeficiency virus) testing. Routine prenatal testing includes screening for HIV, unless you choose not to have this test. HOME CARE INSTRUCTIONS   Avoid all smoking, herbs, alcohol, and unprescribed drugs. These chemicals affect the formation and growth of the baby.  Do not use any tobacco products, including cigarettes, chewing tobacco, and electronic cigarettes. If you need help quitting, ask your health care provider. You may receive counseling support and other resources to help you quit.  Follow your health care provider's instructions regarding medicine use. There are medicines that are either safe or unsafe to take during pregnancy.  Exercise only as directed by your health care provider. Experiencing uterine cramps is a good sign to stop exercising.  Continue to eat regular, healthy meals.  Wear a good support bra for breast tenderness.  Do not use hot tubs, steam rooms, or saunas.  Wear your seat belt at all times when driving.  Avoid raw meat, uncooked cheese, cat litter boxes, and soil used by cats. These carry germs that can cause birth defects in the baby.  Take your prenatal vitamins.  Take 1500-2000 mg of calcium daily starting at the 20th week of pregnancy until you deliver your baby.  Try taking a stool softener (if your health care provider approves) if you develop constipation. Eat more high-fiber foods, such as fresh vegetables or fruit and whole grains. Drink plenty of fluids to keep your urine clear or pale yellow.  Take warm sitz baths to soothe any pain or discomfort caused by hemorrhoids. Use hemorrhoid cream  if your health care provider approves.  If you develop varicose veins, wear support hose. Elevate your feet for 15 minutes, 3-4 times a day. Limit salt in your diet.  Avoid heavy lifting, wear low heel shoes, and practice good posture.  Rest with your legs elevated if you have leg cramps or low back pain.  Visit your dentist if you have not gone yet during your pregnancy. Use a soft toothbrush to brush your teeth and be gentle when you floss.  A sexual relationship may be continued unless your health care provider directs you otherwise.  Continue to go to all your prenatal visits as directed by your health care provider. SEEK MEDICAL CARE IF:   You have dizziness.  You have mild pelvic cramps, pelvic pressure, or nagging pain in the abdominal area.  You have persistent nausea, vomiting, or diarrhea.  You have a bad smelling vaginal  discharge.  You have pain with urination. SEEK IMMEDIATE MEDICAL CARE IF:   You have a fever.  You are leaking fluid from your vagina.  You have spotting or bleeding from your vagina.  You have severe abdominal cramping or pain.  You have rapid weight gain or loss.  You have shortness of breath with chest pain.  You notice sudden or extreme swelling of your face, hands, ankles, feet, or legs.  You have not felt your baby move in over an hour.  You have severe headaches that do not go away with medicine.  You have vision changes.   This information is not intended to replace advice given to you by your health care provider. Make sure you discuss any questions you have with your health care provider.   Document Released: 06/30/2001 Document Revised: 07/27/2014 Document Reviewed: 09/06/2012 Elsevier Interactive Patient Education [employer].

## 2015-11-07 NOTE — Patient Instructions (Signed)
Ceftin 500mg  Twice daily  For 7 days .  Continue on Pulmicort 2 puffs Twice daily  , rinse after use.  Follow up with Dr. Chase Caller in 2-3 months and As needed   Please contact office for sooner follow up if symptoms do not improve or worsen or seek emergency care

## 2015-11-07 NOTE — Assessment & Plan Note (Addendum)
  Continue on Pulmicort 2 puffs Twice daily  , rinse after use.  Follow up with Dr. Chase Caller in 2-3 months and As needed   Please contact office for sooner follow up if symptoms do not improve or worsen or seek emergency care

## 2015-11-07 NOTE — Progress Notes (Signed)
Subjective:  Jessica Caldwell is a 20 y.o. G1P0 at [redacted]w[redacted]d being seen today for ongoing prenatal care.  She is currently monitored for the following issues for this high-risk pregnancy and has Moderate persistent asthma with exacerbation; Hemoptysis; Easy bruising; OM (otitis media); Pleurisy; Chronic cough; Acute tonsillitis; Acquired hypothyroidism; Morbid obesity ([place]); Dysmenorrhea; Acute ear infection; Supervision of normal first pregnancy; Obesity in pregnancy; History of asthma; and Cough on her problem list.  Patient reports more frequent asthma exerbations. Using rescue inhalers 3-4 times per day.  Has also used nebulizers bid on occ.  Has appt with pulmonologist today.  Contractions: Not present. Vag. Bleeding: None.   . Denies leaking of fluid.   The following portions of the patient's history were reviewed and updated as appropriate: allergies, current medications, past family history, past medical history, past social history, past surgical history and problem list. Problem list updated.  Objective:   Filed Vitals:   11/07/15 1054  BP: 107/75  Pulse: 91  Weight: 231 lb (104.781 kg)    Fetal Status: Fetal Heart Rate (bpm): 139 Fundal Height: 23 cm       General:  Alert, oriented and cooperative. Patient is in no acute distress.  Skin: Skin is warm and dry. No rash noted.   Cardiovascular: Normal heart rate noted  Respiratory: Normal respiratory effort, no problems with respiration noted  Abdomen: Soft, gravid, appropriate for gestational age. Pain/Pressure: Absent     Pelvic: Vag. Bleeding: None Vag D/C Character: Thin   Cervical exam deferred        Extremities: Normal range of motion.  Edema: None  Mental Status: Normal mood and affect. Normal behavior. Normal judgment and thought content.   Urinalysis: Urine Protein: Trace Urine Glucose: Negative  Assessment and Plan:  Pregnancy: G1P0 at [redacted]w[redacted]d  1. Encounter for supervision of normal first pregnancy in second  trimester  2. Obesity in pregnancy   3. Moderate persistent asthma with exacerbation Pt with increased use of rescue inhalers.  Has appt with pulmonologist today   Preterm labor symptoms and general obstetric precautions including but not limited to vaginal bleeding, contractions, leaking of fluid and fetal movement were reviewed in detail with the patient. Please refer to After Visit Summary for other counseling recommendations.  Return in about 4 weeks (around 12/05/2015).   Lavonia Drafts, MD

## 2015-11-07 NOTE — Progress Notes (Signed)
Subjective:    Patient ID: Jessica Caldwell, female    DOB: 08-19-1995, 20 y.o.   MRN: [id number]  HPI 20 yo female never smoker with Asthma   11/07/2015 Follow up : Asthma/ Cough  Pt returns for 4 week follow up . She was seen last ov for 2 week hx of severe cough . She was felt to have a post viral cough . Nitric oxide was nml. She was changed from QVAR to Pulmicort .  Pt is pregnant , EDD 02/29/2016 . She is around ~23 wks.  . Mom and boyfriend are with her.  Says she still has cough , now coughing up thick mucus that is yellow and with nasal congestion and stuffiness. Has had some wheezing and . Denies fever, abd pain, rash , chest pain or orthopnea,. No edema.   Past Medical History  Diagnosis Date  . Asthma   . Anemia   . IBS (irritable bowel syndrome)   . Hypothyroid   . Pregnant    Current Outpatient Prescriptions on File Prior to Visit  Medication Sig Dispense Refill  . albuterol (PROAIR HFA) 108 (90 BASE) MCG/ACT inhaler INHALE TWO PUFFS BY MOUTH EVERY 4 HOURS AS NEEDED FOR WHEEZING OR FOR SHORTNESS OF BREATH 1 Inhaler 6  . albuterol (PROVENTIL) (2.5 MG/3ML) 0.083% nebulizer solution Take 3-6 mLs (2.5-5 mg total) by nebulization every 4 (four) hours as needed for wheezing or shortness of breath. 30 vial 0  . levothyroxine (SYNTHROID, LEVOTHROID) 50 MCG tablet Take 1 tablet (50 mcg total) by mouth daily. 30 tablet 6  . montelukast (SINGULAIR) 10 MG tablet Take 1 tablet (10 mg total) by mouth at bedtime. 30 tablet 6  . Budesonide 90 MCG/ACT inhaler Inhale 2 puffs into the lungs 2 (two) times daily. (Patient not taking: Reported on 11/07/2015) 1 Inhaler 5  . dextromethorphan (DELSYM) 30 MG/5ML liquid Take 5 mLs (30 mg total) by mouth every 6 (six) hours as needed for cough. (Patient not taking: Reported on 11/07/2015) 148 mL 1  . pantoprazole (PROTONIX) 20 MG tablet Take 20 mg by mouth. Reported on 11/07/2015    . promethazine (PHENERGAN) 25 MG suppository Place 1 suppository (25 mg  total) rectally every 6 (six) hours as needed for nausea or vomiting. (Patient not taking: Reported on 11/07/2015) 12 each 0  . ranitidine (ZANTAC) 300 MG tablet Take 1 tablet (300 mg total) by mouth at bedtime. (Patient not taking: Reported on 11/07/2015) 30 tablet 3   No current facility-administered medications on file prior to visit.      Review of Systems Constitutional:   No  weight loss, night sweats,  Fevers, chills, fatigue, or  lassitude.  HEENT:   No headaches,  Difficulty swallowing,  Tooth/dental problems, or  Sore throat,                No sneezing, itching, ear ache, + nasal congestion, post nasal drip,   CV:  No chest pain,  Orthopnea, PND, swelling in lower extremities, anasarca, dizziness, palpitations, syncope.   GI  No heartburn, indigestion, abdominal pain, nausea, vomiting, diarrhea, change in bowel habits, loss of appetite, bloody stools.   Resp:   No chest wall deformity  Skin: no rash or lesions.  GU: no dysuria, change in color of urine, no urgency or frequency.  No flank pain, no hematuria   MS:  No joint pain or swelling.  No decreased range of motion.  No back pain.  Psych:  No change in  mood or affect. No depression or anxiety.  No memory loss.         Objective:   Physical Exam Filed Vitals:   11/07/15 1626  BP: 114/74  Pulse: 95  Temp: 98.3 F (36.8 C)  TempSrc: Oral  Height: 5\' 5"  (1.651 m)  Weight: 232 lb (105.235 kg)  SpO2: 98%   GEN: A/Ox3; pleasant , NAD, +pregnant   HEENT:  [place]/AT,  EACs-clear, TMs-wnl, NOSE-clear, THROAT-clear, no lesions, no postnasal drip or exudate noted.  R TM red swollen  Poor dentition   NECK:  Supple w/ fair ROM; no JVD; normal carotid impulses w/o bruits; no thyromegaly or nodules palpated; no lymphadenopathy.  RESP  Clear  P & A; w/o, wheezes/ rales/ or rhonchi.no accessory muscle use, no dullness to percussion  CARD:  RRR, no m/r/g  , no peripheral edema, pulses intact, no cyanosis or  clubbing.  GI:   Soft & nt; nml bowel sounds; no organomegaly or masses detected.  Musco: Warm bil, no deformities or joint swelling noted.   Neuro: alert, no focal deficits noted.    Skin: Warm, no lesions or rashes  Tammy Parrett NP-C  [facility]  11/07/2015        Assessment & Plan:

## 2015-11-08 ENCOUNTER — Encounter (HOSPITAL_COMMUNITY): Payer: Self-pay | Admitting: *Deleted

## 2015-11-08 ENCOUNTER — Inpatient Hospital Stay (HOSPITAL_COMMUNITY)
Admission: AD | Admit: 2015-11-08 | Discharge: 2015-11-08 | Disposition: A | Discharge status: Home / Self Care | Payer: Medicaid Other | Source: Ambulatory Visit | Attending: Family Medicine | Admitting: Family Medicine

## 2015-11-08 DIAGNOSIS — O26892 Other specified pregnancy related conditions, second trimester: Secondary | ICD-10-CM | POA: Insufficient documentation

## 2015-11-08 DIAGNOSIS — Z3402 Encounter for supervision of normal first pregnancy, second trimester: Secondary | ICD-10-CM

## 2015-11-08 DIAGNOSIS — Z880 Allergy status to penicillin: Secondary | ICD-10-CM | POA: Diagnosis not present

## 2015-11-08 DIAGNOSIS — O99512 Diseases of the respiratory system complicating pregnancy, second trimester: Secondary | ICD-10-CM | POA: Insufficient documentation

## 2015-11-08 DIAGNOSIS — J45909 Unspecified asthma, uncomplicated: Secondary | ICD-10-CM | POA: Diagnosis not present

## 2015-11-08 DIAGNOSIS — X509XXA Other and unspecified overexertion or strenuous movements or postures, initial encounter: Secondary | ICD-10-CM | POA: Insufficient documentation

## 2015-11-08 DIAGNOSIS — O99210 Obesity complicating pregnancy, unspecified trimester: Secondary | ICD-10-CM

## 2015-11-08 DIAGNOSIS — Z3A23 23 weeks gestation of pregnancy: Secondary | ICD-10-CM | POA: Insufficient documentation

## 2015-11-08 DIAGNOSIS — E039 Hypothyroidism, unspecified: Secondary | ICD-10-CM | POA: Insufficient documentation

## 2015-11-08 DIAGNOSIS — S39011A Strain of muscle, fascia and tendon of abdomen, initial encounter: Secondary | ICD-10-CM | POA: Diagnosis not present

## 2015-11-08 LAB — URINALYSIS, ROUTINE W REFLEX MICROSCOPIC
BILIRUBIN URINE: NEGATIVE
GLUCOSE, UA: NEGATIVE mg/dL
HGB URINE DIPSTICK: NEGATIVE
Ketones, ur: NEGATIVE mg/dL
Leukocytes, UA: NEGATIVE
Nitrite: NEGATIVE
PH: 5.5 (ref 5.0–8.0)
Protein, ur: NEGATIVE mg/dL

## 2015-11-08 MED ORDER — ACETAMINOPHEN 325 MG PO TABS
650.0000 mg | ORAL_TABLET | Freq: Once | ORAL | Status: AC
  Administered 2015-11-08: 650 mg via ORAL
  Filled 2015-11-08: qty 2

## 2015-11-08 NOTE — MAU Note (Addendum)
Pain in lower left back and around L side and lower abd on L side. Pain started about 0200 when stretched. Denies LOF or bleeding. Pain is worse with movement

## 2015-11-08 NOTE — MAU Provider Note (Signed)
History     CSN: [id number]  Arrival date and time: 11/08/15 2152   First Provider Initiated Contact with Patient 11/08/15 2258      Chief Complaint  Patient presents with  . Abdominal Pain   HPI  This is a 20 year old G1P0 at [redacted]w[redacted]d who presents to the MAU with lower abdominal pain that started yesterday when she was stretching in bed. She arched her back and felt a pain in her abdomen. The pain alternates between "sharp" and "dull". She rates the pain as a 10/10. The pain is worse with laying on her right side. The pain is better if she doesn't move. The pain starts on her right side and radiates to her left. She was able to walk around today without any issues.   She denies any vaginal bleeding or leakage of fluids. No fevers, no chills, no vomiting. She endorses mild nausea, but she states that she has recently had a cold and was diagnosed with an ear infection.  Past Medical History  Diagnosis Date  . Asthma   . Anemia   . IBS (irritable bowel syndrome)   . Hypothyroid   . Pregnant     Past Surgical History  Procedure Laterality Date  . Knee surgery    . Video bronchoscopy Bilateral 06/29/2013    Procedure: VIDEO BRONCHOSCOPY WITHOUT FLUORO;  Surgeon: Brand Males, MD;  Location: [facility];  Service: Cardiopulmonary;  Laterality: Bilateral;  . Tonsillectomy      Family History  Problem Relation Age of Onset  . Asthma Paternal Grandfather   . Asthma Paternal Grandmother   . Heart disease Maternal Grandfather   . Heart disease Paternal Grandfather   . Heart disease Paternal Uncle   . Clotting disorder Paternal Grandmother   . Rheum arthritis Paternal Grandmother   . Cancer Paternal Grandfather     and several uncles.     Social History  Substance Use Topics  . Smoking status: Never Smoker   . Smokeless tobacco: None  . Alcohol Use: No    Allergies:  Allergies  Allergen Reactions  . Azithromycin Hives, Shortness Of Breath and Nausea And Vomiting  .  Augmentin [Amoxicillin-Pot Clavulanate] Nausea And Vomiting and Hives  . Oxycodone-Acetaminophen Nausea And Vomiting  . Penicillins Rash    vomits  . Codeine Hives and Nausea And Vomiting  . Sulfa Antibiotics Hives and Nausea And Vomiting    Prescriptions prior to admission  Medication Sig Dispense Refill Last Dose  . albuterol (PROAIR HFA) 108 (90 BASE) MCG/ACT inhaler INHALE TWO PUFFS BY MOUTH EVERY 4 HOURS AS NEEDED FOR WHEEZING OR FOR SHORTNESS OF BREATH 1 Inhaler 6 Taking  . albuterol (PROVENTIL) (2.5 MG/3ML) 0.083% nebulizer solution Take 3-6 mLs (2.5-5 mg total) by nebulization every 4 (four) hours as needed for wheezing or shortness of breath. 30 vial 0 Taking  . Budesonide 90 MCG/ACT inhaler Inhale 2 puffs into the lungs 2 (two) times daily. (Patient not taking: Reported on 11/07/2015) 1 Inhaler 5 Not Taking  . cefUROXime (CEFTIN) 500 MG tablet Take 1 tablet (500 mg total) by mouth 2 (two) times daily. 14 tablet 0   . dextromethorphan (DELSYM) 30 MG/5ML liquid Take 5 mLs (30 mg total) by mouth every 6 (six) hours as needed for cough. (Patient not taking: Reported on 11/07/2015) 148 mL 1 Not Taking  . levothyroxine (SYNTHROID, LEVOTHROID) 50 MCG tablet Take 1 tablet (50 mcg total) by mouth daily. 30 tablet 6 Taking  . loratadine (CLARITIN) 10  MG tablet Take 10 mg by mouth daily.   Taking  . montelukast (SINGULAIR) 10 MG tablet Take 1 tablet (10 mg total) by mouth at bedtime. 30 tablet 6 Taking  . pantoprazole (PROTONIX) 20 MG tablet Take 20 mg by mouth. Reported on 11/07/2015   Not Taking  . promethazine (PHENERGAN) 25 MG suppository Place 1 suppository (25 mg total) rectally every 6 (six) hours as needed for nausea or vomiting. (Patient not taking: Reported on 11/07/2015) 12 each 0 Not Taking  . ranitidine (ZANTAC) 300 MG tablet Take 1 tablet (300 mg total) by mouth at bedtime. (Patient not taking: Reported on 11/07/2015) 30 tablet 3 Not Taking    Review of Systems  Constitutional:  Negative for fever and chills.  Eyes: Negative for blurred vision.  Respiratory: Negative for shortness of breath.   Cardiovascular: Negative for chest pain.  Gastrointestinal: Positive for nausea and abdominal pain. Negative for heartburn and vomiting.  Genitourinary: Negative for dysuria.  Musculoskeletal: Negative for myalgias.  Skin: Negative for rash.  Neurological: Negative for dizziness, weakness and headaches.  Endo/Heme/Allergies: Negative for environmental allergies.   Physical Exam   Blood pressure 117/65, pulse 86, temperature 98 F (36.7 C), resp. rate 20, height 5\' 5"  (1.651 m), weight 232 lb 3.2 oz (105.325 kg).  Physical Exam  Nursing note and vitals reviewed. Constitutional: She is oriented to person, place, and time. She appears well-developed and well-nourished. No distress.  Eyes: No scleral icterus.  Neck: Normal range of motion.  Cardiovascular: Normal rate.   Respiratory: Effort normal.  GI: Soft. Bowel sounds are normal. She exhibits no distension. There is no rebound and no guarding.  Gravid; mild tenderness to superficial palpation of the left lower quadrant  Musculoskeletal: Normal range of motion.  Neurological: She is alert and oriented to person, place, and time.  Skin: Skin is warm and dry. No rash noted.    MAU Course  Procedures  MDM This is a 20 year old G1P0 at [redacted]w[redacted]d who presents with abdominal pain that started while she was arching back and stretching. UA was performed by the MAU nurse and was negative for any signs of infection. Her abdominal exam is significant for mild tenderness to palpation of the LLQ. She does not have any distention, rebound, or guarding. She likely has a muscle strain. She was also monitored for about 30 minutes at baby had moderate variability without any decelerations.  Assessment and Plan  #Strain of abdominal muscle: - Advised Pt to use Tylenol 650mg  every 6 hours as needed for pain - She can also take warm baths  to help soothe her sore muscles - Return precautions given - Continue routine prenatal care  Evette Doffing 11/08/2015, 11:19 PM   I have participated in the care of this patient and I agree with the above. Serita Grammes CNM 9:00 AM 11/09/2015

## 2015-11-08 NOTE — Discharge Instructions (Signed)
It was so nice to meet you!  I think you pulled one of your stomach muscles. This will heal on its own and should feel better in 2-3 weeks. You can take Tylenol 650mg  every 6 hours as needed for pain. You can also take warm baths to help soothe the sore muscles. You should also avoid arching your back for the next couple of weeks in order to give that muscle time to heal.  Please make sure you are drinking about 6-8 glasses of water a day.

## 2015-11-11 ENCOUNTER — Telehealth: Payer: Self-pay | Admitting: Adult Health

## 2015-11-11 NOTE — Telephone Encounter (Signed)
Last ov on 11/07/15 Patient Instructions     Ceftin 500mg  Twice daily For 7 days .  Continue on Pulmicort 2 puffs Twice daily , rinse after use.  Follow up with Dr. Chase Caller in 2-3 months and As needed  Please contact office for sooner follow up if symptoms do not improve or worsen or seek emergency care     Called spoke with pt's mother. She states that the pt was given ceftin on 11/07/15 but is anxious about taking new medications. She is requesting that the pt be given amoxicillin. I explained to her that I would discuss this with MW.   Spoke with MW and informed him of situation Per verbal order from MW Pt is allergic to Penicillin  Keep on Ceftin due to allergies  Spoke with pt's mother and informed her of the above. She states that she will let her try the medication and if she has any side effects she will contact our office for further instructions. She voiced understanding and had no further questions. Nothing further needed.

## 2015-11-11 NOTE — Telephone Encounter (Signed)
Attempted to contact pt/pt's mother. No answer, voicemail is not set up. Will try back.

## 2015-11-11 NOTE — Telephone Encounter (Signed)
Attempted to contact pt. No answer, voicemail is not set up. Will try back. 

## 2015-11-26 ENCOUNTER — Telehealth: Payer: Self-pay

## 2015-11-26 NOTE — Telephone Encounter (Signed)
Patient's mother called office and states she has been sick for two days with chills and dizziness.   Attempted to call patient's mother back and unable to leave message due to "voicemailbox full". Kathrene Alu RN BSN

## 2015-11-27 ENCOUNTER — Inpatient Hospital Stay (HOSPITAL_COMMUNITY)
Admission: AD | Admit: 2015-11-27 | Discharge: 2015-11-27 | Disposition: A | Discharge status: Home / Self Care | Payer: Medicaid Other | Source: Ambulatory Visit | Attending: Obstetrics and Gynecology | Admitting: Obstetrics and Gynecology

## 2015-11-27 ENCOUNTER — Encounter (HOSPITAL_COMMUNITY): Payer: Self-pay | Admitting: *Deleted

## 2015-11-27 DIAGNOSIS — E860 Dehydration: Secondary | ICD-10-CM | POA: Insufficient documentation

## 2015-11-27 DIAGNOSIS — O99282 Endocrine, nutritional and metabolic diseases complicating pregnancy, second trimester: Secondary | ICD-10-CM | POA: Diagnosis not present

## 2015-11-27 DIAGNOSIS — O99512 Diseases of the respiratory system complicating pregnancy, second trimester: Secondary | ICD-10-CM | POA: Diagnosis not present

## 2015-11-27 DIAGNOSIS — Z880 Allergy status to penicillin: Secondary | ICD-10-CM | POA: Insufficient documentation

## 2015-11-27 DIAGNOSIS — E039 Hypothyroidism, unspecified: Secondary | ICD-10-CM | POA: Insufficient documentation

## 2015-11-27 DIAGNOSIS — J45909 Unspecified asthma, uncomplicated: Secondary | ICD-10-CM | POA: Insufficient documentation

## 2015-11-27 DIAGNOSIS — O26892 Other specified pregnancy related conditions, second trimester: Secondary | ICD-10-CM | POA: Diagnosis not present

## 2015-11-27 DIAGNOSIS — Z3A26 26 weeks gestation of pregnancy: Secondary | ICD-10-CM | POA: Insufficient documentation

## 2015-11-27 DIAGNOSIS — K589 Irritable bowel syndrome without diarrhea: Secondary | ICD-10-CM | POA: Diagnosis not present

## 2015-11-27 DIAGNOSIS — O99612 Diseases of the digestive system complicating pregnancy, second trimester: Secondary | ICD-10-CM | POA: Diagnosis not present

## 2015-11-27 DIAGNOSIS — R109 Unspecified abdominal pain: Secondary | ICD-10-CM | POA: Diagnosis present

## 2015-11-27 DIAGNOSIS — O99210 Obesity complicating pregnancy, unspecified trimester: Secondary | ICD-10-CM

## 2015-11-27 DIAGNOSIS — Z3402 Encounter for supervision of normal first pregnancy, second trimester: Secondary | ICD-10-CM

## 2015-11-27 LAB — URINALYSIS, ROUTINE W REFLEX MICROSCOPIC
BILIRUBIN URINE: NEGATIVE
GLUCOSE, UA: NEGATIVE mg/dL
HGB URINE DIPSTICK: NEGATIVE
Ketones, ur: NEGATIVE mg/dL
Leukocytes, UA: NEGATIVE
Nitrite: NEGATIVE
PH: 6 (ref 5.0–8.0)
Protein, ur: NEGATIVE mg/dL
SPECIFIC GRAVITY, URINE: 1.025 (ref 1.005–1.030)

## 2015-11-27 MED ORDER — SODIUM CHLORIDE 0.9 % IV BOLUS (SEPSIS): 1000.0000 mL | Freq: Once | INTRAVENOUS | Status: DC

## 2015-11-27 MED ORDER — RANITIDINE HCL 300 MG PO TABS
300.0000 mg | ORAL_TABLET | Freq: Two times a day (BID) | ORAL | Status: DC
Start: 2015-11-27 — End: 2016-08-20

## 2015-11-27 MED ORDER — PROMETHAZINE HCL 25 MG PO TABS: 25.0000 mg | ORAL_TABLET | Freq: Four times a day (QID) | ORAL | Status: DC | PRN

## 2015-11-27 NOTE — MAU Provider Note (Signed)
History     CSN: [id number]  Arrival date and time: 11/27/15 2110   First Provider Initiated Contact with Patient 11/27/15 2306      Chief Complaint  Patient presents with  . Emesis  . Abdominal Pain   HPI 21 yr G1P0 At Estimated Date of Delivery: 02/29/16 At [redacted]w[redacted]d Complains of nausea resolved with using her mother's [phenergan po. Pt had PR phenergan but no p.o.tab . Pt declines iv hydration , desires to attempt recovery with use of PO phenergan.  U/a is without ketones, and SG only 1.025/  Will attempt po rehydration at home.    Past Medical History  Diagnosis Date  . Asthma   . Anemia   . IBS (irritable bowel syndrome)   . Hypothyroid   . Pregnant     Past Surgical History  Procedure Laterality Date  . Knee surgery    . Video bronchoscopy Bilateral 06/29/2013    Procedure: VIDEO BRONCHOSCOPY WITHOUT FLUORO;  Surgeon: Brand Males, MD;  Location: [facility];  Service: Cardiopulmonary;  Laterality: Bilateral;  . Tonsillectomy      Family History  Problem Relation Age of Onset  . Asthma Paternal Grandfather   . Asthma Paternal Grandmother   . Heart disease Maternal Grandfather   . Heart disease Paternal Grandfather   . Heart disease Paternal Uncle   . Clotting disorder Paternal Grandmother   . Rheum arthritis Paternal Grandmother   . Cancer Paternal Grandfather     and several uncles.     Social History  Substance Use Topics  . Smoking status: Never Smoker   . Smokeless tobacco: None  . Alcohol Use: No    Allergies:  Allergies  Allergen Reactions  . Azithromycin Hives, Shortness Of Breath and Nausea And Vomiting  . Augmentin [Amoxicillin-Pot Clavulanate] Nausea And Vomiting and Hives  . Oxycodone-Acetaminophen Nausea And Vomiting  . Penicillins Rash    Vomits Has patient had a PCN reaction causing immediate rash, facial/tongue/throat swelling, SOB or lightheadedness with hypotension: No Has patient had a PCN reaction causing severe rash  involving mucus membranes or skin necrosis: No Has patient had a PCN reaction that required hospitalization No Has patient had a PCN reaction occurring within the last 10 years: Yes If all of the above answers are "NO", then may proceed with Cephalosporin use.   . Codeine Hives and Nausea And Vomiting  . Sulfa Antibiotics Hives and Nausea And Vomiting    Prescriptions prior to admission  Medication Sig Dispense Refill Last Dose  . acetaminophen (TYLENOL) 500 MG tablet Take 500 mg by mouth every 6 (six) hours as needed for headache.   Past Week at Unknown time  . albuterol (PROAIR HFA) 108 (90 BASE) MCG/ACT inhaler INHALE TWO PUFFS BY MOUTH EVERY 4 HOURS AS NEEDED FOR WHEEZING OR FOR SHORTNESS OF BREATH 1 Inhaler 6 11/27/2015 at Unknown time  . albuterol (PROVENTIL) (2.5 MG/3ML) 0.083% nebulizer solution Take 3-6 mLs (2.5-5 mg total) by nebulization every 4 (four) hours as needed for wheezing or shortness of breath. 30 vial 0 Past Week at Unknown time  . Budesonide 90 MCG/ACT inhaler Inhale 2 puffs into the lungs 2 (two) times daily. 1 Inhaler 5 11/27/2015 at Unknown time  . levothyroxine (SYNTHROID, LEVOTHROID) 50 MCG tablet Take 1 tablet (50 mcg total) by mouth daily. 30 tablet 6 11/27/2015 at Unknown time  . montelukast (SINGULAIR) 10 MG tablet Take 1 tablet (10 mg total) by mouth at bedtime. 30 tablet 6 11/26/2015 at Unknown time  .  Pediatric Multiple Vit-C-FA (FLINSTONES GUMMIES OMEGA-3 DHA) CHEW Chew 2 each by mouth 2 (two) times daily.   11/27/2015 at Unknown time  . dextromethorphan (DELSYM) 30 MG/5ML liquid Take 5 mLs (30 mg total) by mouth every 6 (six) hours as needed for cough. (Patient not taking: Reported on 11/07/2015) 148 mL 1 Not Taking  . promethazine (PHENERGAN) 25 MG suppository Place 1 suppository (25 mg total) rectally every 6 (six) hours as needed for nausea or vomiting. (Patient not taking: Reported on 11/07/2015) 12 each 0 Not Taking  . ranitidine (ZANTAC) 300 MG tablet Take 1  tablet (300 mg total) by mouth at bedtime. (Patient not taking: Reported on 11/07/2015) 30 tablet 3 Not Taking    Review of Systems  HENT: Negative.   Respiratory: Negative.   Cardiovascular: Negative.   Gastrointestinal: Positive for nausea, vomiting and diarrhea. Negative for heartburn, constipation and blood in stool.  Genitourinary: Negative for dysuria.  Neurological:       Neg   Physical Exam   Blood pressure 112/64, pulse 96, temperature 98.6 F (37 C), temperature source Oral, resp. rate 18, height 5\' 5"  (1.651 m), weight 107.049 kg (236 lb), SpO2 99 %.  Physical Exam  Constitutional: She is oriented to person, place, and time. She appears well-developed and well-nourished.  HENT:  Head: Normocephalic.  Neurological: She is alert and oriented to person, place, and time. She has normal reflexes.  Skin: Skin is warm and dry.  No significant loss of turgor.  Psychiatric: She has a normal mood and affect. Her behavior is normal. Judgment and thought content normal.    MAU Course  Procedures  MDM brief  Assessment and Plan  Minimal dehydration.  Jessica Caldwell V 11/27/2015, 11:12 PM

## 2015-11-27 NOTE — MAU Note (Signed)
Pt reports for the past 3 days she has been nauseated and vomiting at times. Also report pain in upper and lower abd and chest pain, indicates mid sternum.

## 2015-11-28 ENCOUNTER — Encounter: Payer: Self-pay | Admitting: Obstetrics & Gynecology

## 2015-12-09 ENCOUNTER — Encounter: Payer: Medicaid Other | Admitting: Obstetrics & Gynecology

## 2015-12-09 ENCOUNTER — Ambulatory Visit (INDEPENDENT_AMBULATORY_CARE_PROVIDER_SITE_OTHER): Payer: Self-pay | Admitting: Obstetrics & Gynecology

## 2015-12-09 ENCOUNTER — Encounter: Payer: Self-pay | Admitting: Obstetrics & Gynecology

## 2015-12-09 VITALS — BP 103/60 | HR 80 | Wt 236.0 lb

## 2015-12-09 DIAGNOSIS — O99210 Obesity complicating pregnancy, unspecified trimester: Secondary | ICD-10-CM

## 2015-12-09 DIAGNOSIS — Z3403 Encounter for supervision of normal first pregnancy, third trimester: Secondary | ICD-10-CM

## 2015-12-09 DIAGNOSIS — J4541 Moderate persistent asthma with (acute) exacerbation: Secondary | ICD-10-CM

## 2015-12-09 NOTE — Patient Instructions (Signed)

## 2015-12-09 NOTE — Progress Notes (Signed)
28 week labs with TSH not drawn today. Pt to f/u in 2 days to have labs drawn.  Did not have time to wait today.

## 2015-12-09 NOTE — Progress Notes (Signed)
Subjective:  Jessica Caldwell is a 20 y.o. G1P0 at [redacted]w[redacted]d being seen today for ongoing prenatal care.  She is currently monitored for the following issues for this high-risk pregnancy and has Moderate persistent asthma with exacerbation; Hemoptysis; Easy bruising; Pleurisy; Chronic cough; Acquired hypothyroidism; Morbid obesity ([place]); Supervision of normal first pregnancy; Obesity in pregnancy; and History of asthma on her problem list.  Patient reports daily nausea.  Contractions: Not present. Vag. Bleeding: None.   . Denies leaking of fluid.   The following portions of the patient's history were reviewed and updated as appropriate: allergies, current medications, past family history, past medical history, past social history, past surgical history and problem list. Problem list updated.  Objective:   Filed Vitals:   12/09/15 0843  BP: 103/60  Pulse: 80  Weight: 236 lb (107.049 kg)    Fetal Status: Fetal Heart Rate (bpm): 153 Fundal Height: 29 cm       General:  Alert, oriented and cooperative. Patient is in no acute distress.  Skin: Skin is warm and dry. No rash noted.   Cardiovascular: Normal heart rate noted  Respiratory: Normal respiratory effort, no problems with respiration noted  Abdomen: Soft, gravid, appropriate for gestational age. Pain/Pressure: Absent     Pelvic: Vag. Bleeding: None Vag D/C Character: Thin   Cervical exam deferred        Extremities: Normal range of motion.  Edema: None  Mental Status: Normal mood and affect. Normal behavior. Normal judgment and thought content.   Urinalysis: Urine Protein: Trace Urine Glucose: Negative  Assessment and Plan:  Pregnancy: G1P0 at [redacted]w[redacted]d  1. Encounter for supervision of normal first pregnancy in third trimester  2. Obesity in pregnancy Pt c/o nausea and not keeping food down.  Weight increased 6# since last visit.   Will followed.  Rx'd phenergan in the MAU.   3. Morbid obesity, unspecified obesity type ([place]) See  above  4. Moderate persistent asthma with exacerbation Seen by Pulm.  Meds adjusted.  Pt and mother do not recall the name of the pulmonologist.  They were asked to bring the name at the next visit so that we may request records.  No info seen on Care Everywhere.  Preterm labor symptoms and general obstetric precautions including but not limited to vaginal bleeding, contractions, leaking of fluid and fetal movement were reviewed in detail with the patient. Please refer to After Visit Summary for other counseling recommendations.  Return in about 2 weeks (around 12/23/2015).   Lavonia Drafts, MD

## 2015-12-11 ENCOUNTER — Other Ambulatory Visit: Payer: Self-pay

## 2015-12-12 ENCOUNTER — Other Ambulatory Visit (INDEPENDENT_AMBULATORY_CARE_PROVIDER_SITE_OTHER): Payer: Self-pay

## 2015-12-12 DIAGNOSIS — O99013 Anemia complicating pregnancy, third trimester: Secondary | ICD-10-CM

## 2015-12-12 DIAGNOSIS — Z36 Encounter for antenatal screening of mother: Secondary | ICD-10-CM

## 2015-12-12 DIAGNOSIS — Z3490 Encounter for supervision of normal pregnancy, unspecified, unspecified trimester: Secondary | ICD-10-CM

## 2015-12-12 LAB — CBC
HCT: 31.6 % — ABNORMAL LOW (ref 35.0–45.0)
HEMOGLOBIN: 10.2 g/dL — AB (ref 11.7–15.5)
MCH: 26.7 pg — ABNORMAL LOW (ref 27.0–33.0)
MCHC: 32.3 g/dL (ref 32.0–36.0)
MCV: 82.7 fL (ref 80.0–100.0)
MPV: 10.7 fL (ref 7.5–12.5)
PLATELETS: 229 10*3/uL (ref 140–400)
RBC: 3.82 MIL/uL (ref 3.80–5.10)
RDW: 14.5 % (ref 11.0–15.0)
WBC: 8.4 10*3/uL (ref 3.8–10.8)

## 2015-12-12 NOTE — Progress Notes (Unsigned)
Patient present for 28 week labs. Kathrene Alu RN BSN

## 2015-12-13 LAB — HIV ANTIBODY (ROUTINE TESTING W REFLEX): HIV 1&2 Ab, 4th Generation: NONREACTIVE

## 2015-12-13 LAB — GLUCOSE TOLERANCE, 1 HOUR (50G) W/O FASTING: GLUCOSE, 1 HR, GESTATIONAL: 131 mg/dL (ref <140)

## 2015-12-13 LAB — TSH: TSH: 1.26 m[IU]/L (ref 0.50–4.30)

## 2015-12-14 LAB — RPR

## 2015-12-17 ENCOUNTER — Telehealth: Payer: Self-pay | Admitting: *Deleted

## 2015-12-17 DIAGNOSIS — O99013 Anemia complicating pregnancy, third trimester: Secondary | ICD-10-CM | POA: Insufficient documentation

## 2015-12-17 DIAGNOSIS — Z3403 Encounter for supervision of normal first pregnancy, third trimester: Secondary | ICD-10-CM

## 2015-12-17 NOTE — Telephone Encounter (Signed)
LM on voicemail of normal 1 hr GTT. 

## 2015-12-18 ENCOUNTER — Other Ambulatory Visit: Payer: Self-pay | Admitting: Family Medicine

## 2015-12-18 ENCOUNTER — Telehealth: Payer: Self-pay

## 2015-12-18 MED ORDER — FERROUS SULFATE 325 (65 FE) MG PO TABS: 325.0000 mg | ORAL_TABLET | Freq: Two times a day (BID) | ORAL | Status: DC

## 2015-12-18 NOTE — Telephone Encounter (Signed)
-----   Message from Lavonia Drafts, MD sent at 12/17/2015 10:02 AM EDT ----- Please call pt.  She is anemic.  Please ask her to begin OTC FeSO4 bid.  Need to add to med list.  Thx. clh-S

## 2015-12-18 NOTE — Telephone Encounter (Signed)
Med filled, however all future refills need to come from new PCP.

## 2015-12-18 NOTE — Telephone Encounter (Signed)
Attempted to reach patient. Left message. Kathrene Alu RN BSN

## 2015-12-23 ENCOUNTER — Encounter: Payer: Self-pay | Admitting: Obstetrics & Gynecology

## 2016-01-20 ENCOUNTER — Encounter: Payer: Self-pay | Admitting: Adult Health

## 2016-01-20 ENCOUNTER — Ambulatory Visit (INDEPENDENT_AMBULATORY_CARE_PROVIDER_SITE_OTHER): Payer: Medicaid Other | Admitting: Adult Health

## 2016-01-20 VITALS — BP 134/86 | HR 114 | Temp 98.0°F | Ht 64.0 in | Wt 239.0 lb

## 2016-01-20 DIAGNOSIS — J4541 Moderate persistent asthma with (acute) exacerbation: Secondary | ICD-10-CM | POA: Diagnosis not present

## 2016-01-20 MED ORDER — PREDNISONE 10 MG PO TABS: ORAL_TABLET | ORAL | Status: DC

## 2016-01-20 MED ORDER — CEFUROXIME AXETIL 500 MG PO TABS: 500.0000 mg | ORAL_TABLET | Freq: Two times a day (BID) | ORAL | Status: AC

## 2016-01-20 NOTE — Patient Instructions (Signed)
Recommend to be seen at [facility] ER for ongoing shortness of breath.  Prednisone taper over next week.  Ceftin 500mg  Twice daily  For 7 days .  Continue on Pulmicort 2 puffs Twice daily  , rinse after use.  Follow up with Dr. Chase Caller in 4 weeks and and As needed   Please contact office for sooner follow up if symptoms do not improve or worsen or seek emergency care

## 2016-01-20 NOTE — Progress Notes (Signed)
Subjective:    Patient ID: Jessica Caldwell, female    DOB: October 22, 1995, 20 y.o.   MRN: [id number]  HPI 20 yo female never smoker with Asthma   01/20/2016 Follow up : Asthma/ Cough  Pt returns for acute office visit. Complains of chest congestion/tightness, prod cough with bloody mucus 2 weeks ago, SOB and wheezing. Low grade fever at times. Denies any nausea or vomiting.  Cough is harsh at times. O2 sats 100% in office at rest .  Walk test in office with O2 sats 99 to 100% on room air. HR 110- to 120 .  Seen in office in April for OM , tx w/ abx. Says she completely recovered.  Says she feels terrible and is sob for last 2 weeks. No calf pain . Some ankle swelling bilaterally.  Pt is pregnant , EDD 02/29/2016 . She is around ~34  wks.  . Mom is with her today.  EDD 02/29/16 .    Past Medical History  Diagnosis Date  . Asthma   . Anemia   . IBS (irritable bowel syndrome)   . Hypothyroid   . Pregnant    Current Outpatient Prescriptions on File Prior to Visit  Medication Sig Dispense Refill  . acetaminophen (TYLENOL) 500 MG tablet Take 500 mg by mouth every 6 (six) hours as needed for headache.    . albuterol (PROAIR HFA) 108 (90 BASE) MCG/ACT inhaler INHALE TWO PUFFS BY MOUTH EVERY 4 HOURS AS NEEDED FOR WHEEZING OR FOR SHORTNESS OF BREATH 1 Inhaler 6  . albuterol (PROVENTIL) (2.5 MG/3ML) 0.083% nebulizer solution Take 3-6 mLs (2.5-5 mg total) by nebulization every 4 (four) hours as needed for wheezing or shortness of breath. 30 vial 0  . Budesonide 90 MCG/ACT inhaler Inhale 2 puffs into the lungs 2 (two) times daily. 1 Inhaler 5  . ferrous sulfate 325 (65 FE) MG tablet Take 1 tablet (325 mg total) by mouth 2 (two) times daily with a meal. 60 tablet 3  . levothyroxine (SYNTHROID, LEVOTHROID) 50 MCG tablet TAKE ONE TABLET BY MOUTH ONCE DAILY 30 tablet 0  . montelukast (SINGULAIR) 10 MG tablet Take 1 tablet (10 mg total) by mouth at bedtime. 30 tablet 6  . Pediatric Multiple Vit-C-FA  (FLINSTONES GUMMIES OMEGA-3 DHA) CHEW Chew 2 each by mouth 2 (two) times daily.    . ranitidine (ZANTAC) 300 MG tablet Take 1 tablet (300 mg total) by mouth 2 (two) times daily. 30 tablet 3  . dextromethorphan (DELSYM) 30 MG/5ML liquid Take 5 mLs (30 mg total) by mouth every 6 (six) hours as needed for cough. (Patient not taking: Reported on 01/20/2016) 148 mL 1  . promethazine (PHENERGAN) 25 MG tablet Take 1 tablet (25 mg total) by mouth every 6 (six) hours as needed for nausea or vomiting (nausea). (Patient not taking: Reported on 01/20/2016) 30 tablet 2   No current facility-administered medications on file prior to visit.      Review of Systems  Constitutional:   No  weight loss, night sweats,  Fevers, chills,  +fatigue, or  lassitude.  HEENT:   No headaches,  Difficulty swallowing,  Tooth/dental problems, or  Sore throat,                No sneezing, itching, ear ache, + nasal congestion, post nasal drip,   CV:  No chest pain,  Orthopnea, PND, swelling in lower extremities, anasarca, dizziness, palpitations, syncope.   GI  No heartburn, indigestion, abdominal pain, nausea, vomiting, diarrhea,  change in bowel habits, loss of appetite, bloody stools.   Resp:   No chest wall deformity  Skin: no rash or lesions.  GU: no dysuria, change in color of urine, no urgency or frequency.  No flank pain, no hematuria   MS:  No joint pain or swelling.  No decreased range of motion.  No back pain.  Psych:  No change in mood or affect. No depression or anxiety.  No memory loss.         Objective:   Physical Exam  Filed Vitals:   01/20/16 1649  BP: 134/86  Pulse: 114  Temp: 98 F (36.7 C)  TempSrc: Oral  Height: 5\' 4"  (1.626 m)  Weight: 239 lb (108.41 kg)  SpO2: 96%   GEN: A/Ox3; pleasant , NAD, +pregnant   HEENT:  [place]/AT,  EACs-clear, TMs-wnl, NOSE-clear, THROAT-clear, no lesions, no postnasal drip or exudate noted.  Poor dentition   NECK:  Supple w/ fair ROM; no JVD; normal  carotid impulses w/o bruits; no thyromegaly or nodules palpated; no lymphadenopathy.no stidor   RESP  Faint exp wheeze no accessory muscle use, no dullness to percussion +barky cough   CARD:  RRR, no m/r/g  , no peripheral edema, pulses intact, no cyanosis or clubbing. Neg calf pain.   GI:   Soft & nt; nml bowel sounds; no organomegaly or masses detected.  Musco: Warm bil, no deformities or joint swelling noted.   Neuro: alert, no focal deficits noted.    Skin: Warm, no lesions or rashes  Neyland Pettengill NP-C  [facility]  01/20/2016        Assessment & Plan:

## 2016-01-20 NOTE — Assessment & Plan Note (Signed)
Suspected exacerbation but very difficult to gauge her symptoms as she has multiple somatic complaints .  Exam is remarkable only for cough and very faint wheeze . She does have tachycardia but no desaturations with ambulation  Her OB care is done at [facility] --have discussed with pt and mom that is hard to determine if this is just asthma vs other etiology such as PE . Would feel more comfortable with evaluation done in ER at [facility] with fetal montioring as well.  They are in aggreement .  rx was given for abx and steroids for asthmatic bronchitis if she is ruled out for other etiology .  Case discussed with Dr. Chase Caller .   Plan  Recommend to be seen at [facility] ER for ongoing shortness of breath.  Prednisone taper over next week.  Ceftin 500mg  Twice daily  For 7 days .  Continue on Pulmicort 2 puffs Twice daily  , rinse after use.  Follow up with Dr. Chase Caller in 4 weeks and and As needed   Please contact office for sooner follow up if symptoms do not improve or worsen or seek emergency care

## 2016-02-06 ENCOUNTER — Ambulatory Visit: Payer: Medicaid Other | Admitting: Internal Medicine

## 2016-03-13 ENCOUNTER — Ambulatory Visit: Payer: Medicaid Other | Admitting: Internal Medicine

## 2016-04-10 ENCOUNTER — Other Ambulatory Visit (INDEPENDENT_AMBULATORY_CARE_PROVIDER_SITE_OTHER): Payer: Medicaid Other

## 2016-04-10 ENCOUNTER — Ambulatory Visit (INDEPENDENT_AMBULATORY_CARE_PROVIDER_SITE_OTHER): Payer: Medicaid Other | Admitting: Adult Health

## 2016-04-10 ENCOUNTER — Encounter: Payer: Self-pay | Admitting: Adult Health

## 2016-04-10 ENCOUNTER — Ambulatory Visit: Payer: Medicaid Other | Admitting: Adult Health

## 2016-04-10 VITALS — BP 102/66 | HR 79 | Temp 98.2°F | Ht 64.0 in | Wt 223.6 lb

## 2016-04-10 DIAGNOSIS — J45909 Unspecified asthma, uncomplicated: Secondary | ICD-10-CM

## 2016-04-10 DIAGNOSIS — J4541 Moderate persistent asthma with (acute) exacerbation: Secondary | ICD-10-CM | POA: Diagnosis not present

## 2016-04-10 LAB — BASIC METABOLIC PANEL
BUN: 12 mg/dL (ref 6–23)
CALCIUM: 8.8 mg/dL (ref 8.4–10.5)
CO2: 28 meq/L (ref 19–32)
CREATININE: 0.75 mg/dL (ref 0.40–1.20)
Chloride: 107 mEq/L (ref 96–112)
GFR: 104.84 mL/min (ref >60.00)
GLUCOSE: 73 mg/dL (ref 70–99)
Potassium: 4.3 mEq/L (ref 3.5–5.1)
SODIUM: 141 meq/L (ref 135–145)

## 2016-04-10 LAB — CBC
HEMATOCRIT: 35.8 % — AB (ref 36.0–49.0)
HEMOGLOBIN: 12.1 g/dL (ref 12.0–16.0)
MCHC: 33.8 g/dL (ref 31.0–37.0)
MCV: 85.2 fl (ref 78.0–98.0)
Platelets: 230 10*3/uL (ref 150.0–575.0)
RBC: 4.2 Mil/uL (ref 3.80–5.70)
RDW: 15.4 % (ref 11.4–15.5)
WBC: 5.6 10*3/uL (ref 4.5–13.5)

## 2016-04-10 LAB — NITRIC OXIDE: NITRIC OXIDE: 5

## 2016-04-10 MED ORDER — BUDESONIDE-FORMOTEROL FUMARATE 80-4.5 MCG/ACT IN AERO: 2.0000 | INHALATION_SPRAY | Freq: Two times a day (BID) | RESPIRATORY_TRACT | 5 refills | Status: DC

## 2016-04-10 NOTE — Progress Notes (Signed)
Subjective:    Patient ID: Jessica Caldwell, female    DOB: Nov 16, 1995, 20 y.o.   MRN: [id number]  HPI20 yo female never smoker with Asthma   04/10/2016 Acute OV  : Asthma Pt presents for an acute office visit. Patient complains of persistent asthma symptoms with cough, wheezing and shortness of breath. She says over the last several months during her pregnancy. She had more asthmatic symptoms. She was taken Pulmicort without much improvement. She had to use albuterol several times. She complains of 3 days ago. She had asthma exacerbation to the point where she felt like she lost her breath is scared her boyfriend and he started to do CPR. Patient said her breath came back to her and was able to improve with a nebulizer treatment with albuterol.  She is post partum , had baby girl  in August. Did well during delivery . She denies any fever, chest pain, orthopnea, PND, leg swelling, abdominal pain, nausea, vomiting, calf pain, swelling or hemoptysis. She is not is breast feeding.  Seen by PCP this week, dx with OM , started on Amoxicillin.  Spirometry today shows normal -poor effort. FEV1 88%, ratio 77, FVC 100%.  FeNO today is 5 .  Walk test in office with no drop in o2 sat on RA , O2 sats 98% walking.     Past Medical History:  Diagnosis Date  . Anemia   . Asthma   . Hypothyroid   . IBS (irritable bowel syndrome)   . Pregnant    Current Outpatient Prescriptions on File Prior to Visit  Medication Sig Dispense Refill  . acetaminophen (TYLENOL) 500 MG tablet Take 500 mg by mouth every 6 (six) hours as needed for headache.    . albuterol (PROAIR HFA) 108 (90 BASE) MCG/ACT inhaler INHALE TWO PUFFS BY MOUTH EVERY 4 HOURS AS NEEDED FOR WHEEZING OR FOR SHORTNESS OF BREATH 1 Inhaler 6  . albuterol (PROVENTIL) (2.5 MG/3ML) 0.083% nebulizer solution Take 3-6 mLs (2.5-5 mg total) by nebulization every 4 (four) hours as needed for wheezing or shortness of breath. 30 vial 0  . ferrous sulfate 325 (65  FE) MG tablet Take 1 tablet (325 mg total) by mouth 2 (two) times daily with a meal. 60 tablet 3  . levothyroxine (SYNTHROID, LEVOTHROID) 50 MCG tablet TAKE ONE TABLET BY MOUTH ONCE DAILY 30 tablet 0  . montelukast (SINGULAIR) 10 MG tablet Take 1 tablet (10 mg total) by mouth at bedtime. 30 tablet 6  . promethazine (PHENERGAN) 25 MG tablet Take 1 tablet (25 mg total) by mouth every 6 (six) hours as needed for nausea or vomiting (nausea). 30 tablet 2  . ranitidine (ZANTAC) 300 MG tablet Take 1 tablet (300 mg total) by mouth 2 (two) times daily. 30 tablet 3  . Budesonide 90 MCG/ACT inhaler Inhale 2 puffs into the lungs 2 (two) times daily. (Patient not taking: Reported on 04/10/2016) 1 Inhaler 5  . dextromethorphan (DELSYM) 30 MG/5ML liquid Take 5 mLs (30 mg total) by mouth every 6 (six) hours as needed for cough. (Patient not taking: Reported on 04/10/2016) 148 mL 1  . predniSONE (DELTASONE) 10 MG tablet 4 tabs for 2 days, then 3 tabs for 2 days, 2 tabs for 2 days, then 1 tab for 2 days, then stop (Patient not taking: Reported on 04/10/2016) 20 tablet 0   No current facility-administered medications on file prior to visit.       Review of Systems Constitutional:   No  weight loss, night sweats,  Fevers, chills,  +fatigue, or  lassitude.  HEENT:   No headaches,  Difficulty swallowing,  Tooth/dental problems, or  Sore throat,                No sneezing, itching, ear ache, + nasal congestion, post nasal drip,   CV:  No chest pain,  Orthopnea, PND, swelling in lower extremities, anasarca, dizziness, palpitations, syncope.   GI  No heartburn, indigestion, abdominal pain, nausea, vomiting, diarrhea, change in bowel habits, loss of appetite, bloody stools.   Resp:   No chest wall deformity  Skin: no rash or lesions.  GU: no dysuria, change in color of urine, no urgency or frequency.  No flank pain, no hematuria   MS:  No joint pain or swelling.  No decreased range of motion.  No back  pain.  Psych:  No change in mood or affect. No depression or anxiety.  No memory loss.         Objective:   Physical Exam Vitals:   04/10/16 1425  BP: 102/66  Pulse: 79  Temp: 98.2 F (36.8 C)  TempSrc: Oral  SpO2: 95%  Weight: 223 lb 9.6 oz (101.4 kg)  Height: 5\' 4"  (1.626 m)   GEN: A/Ox3; pleasant , NAD, +pregnant    HEENT:  [place]/AT,  EACs-clear, TMs-wnl, NOSE-clear, THROAT-clear, no lesions, no postnasal drip or exudate noted.  Poor dentition   NECK:  Supple w/ fair ROM; no JVD; normal carotid impulses w/o bruits; no thyromegaly or nodules palpated; no lymphadenopathy.  no stidor   RESP  Faint exp wheeze  no accessory muscle use, no dullness to percussion +barky cough   CARD:  RRR, no m/r/g  , no peripheral edema, pulses intact, no cyanosis or clubbing. Neg calf pain.   GI:   Soft & nt; nml bowel sounds; no organomegaly or masses detected.   Musco: Warm bil, no deformities or joint swelling noted.   Neuro: alert, no focal deficits noted.    Skin: Warm, no lesions or rashes  Tammy Parrett NP-C  [facility]  04/10/2016        Assessment & Plan:

## 2016-04-10 NOTE — Assessment & Plan Note (Signed)
Asthma exacerbation  Although spirometry is essentially normal and FeNO is low.  Will check cxr and labs with cbc , bnp and D Dimer (if positive d dimer , check CTA chest )   Plan  Patient Instructions  Begin Symbicort 80 2 puffs Twice daily  .  Stop Pulmicort .  Use Albuterol Inhaler every 4hr as needed , this is your emergency /rescue inhaler .  Chest xray and labs today .  Follow up Dr. Chase Caller in 4 weeks and As needed   Please contact office for sooner follow up if symptoms do not improve or worsen or seek emergency care

## 2016-04-10 NOTE — Addendum Note (Signed)
Addended by: Osa Craver on: 04/10/2016 03:15 PM   Modules accepted: Orders

## 2016-04-10 NOTE — Patient Instructions (Addendum)
Begin Symbicort 80 2 puffs Twice daily  .  Stop Pulmicort .  Use Albuterol Inhaler every 4hr as needed , this is your emergency /rescue inhaler .  Chest xray and labs today .  Follow up Dr. Chase Caller in 4 weeks and As needed   Please contact office for sooner follow up if symptoms do not improve or worsen or seek emergency care

## 2016-04-11 LAB — D-DIMER, QUANTITATIVE: D-Dimer, Quant: 0.95 mcg/mL FEU — ABNORMAL HIGH (ref <0.50)

## 2016-04-13 ENCOUNTER — Other Ambulatory Visit: Payer: Self-pay | Admitting: Adult Health

## 2016-04-13 ENCOUNTER — Ambulatory Visit (INDEPENDENT_AMBULATORY_CARE_PROVIDER_SITE_OTHER)
Admission: RE | Admit: 2016-04-13 | Discharge: 2016-04-13 | Disposition: A | Discharge status: Home / Self Care | Payer: Medicaid Other | Source: Ambulatory Visit | Attending: Adult Health | Admitting: Adult Health

## 2016-04-13 DIAGNOSIS — R791 Abnormal coagulation profile: Secondary | ICD-10-CM | POA: Diagnosis not present

## 2016-04-13 DIAGNOSIS — R7989 Other specified abnormal findings of blood chemistry: Secondary | ICD-10-CM

## 2016-04-13 DIAGNOSIS — R0600 Dyspnea, unspecified: Secondary | ICD-10-CM

## 2016-04-13 MED ORDER — IOPAMIDOL (ISOVUE-370) INJECTION 76%
80.0000 mL | Freq: Once | INTRAVENOUS | Status: AC | PRN
  Administered 2016-04-13: 80 mL via INTRAVENOUS

## 2016-04-13 NOTE — Progress Notes (Signed)
Notes Recorded by Rinaldo Ratel, CMA on 04/13/2016 at 10:37 AM EDT Pt's mother returned call (DPR on file). Discussed results/recs as stated by TP with Mom. CTA ordered STAT. Mom did mention another appt this afternoon (she needs to know CTA time for transportation) and another contact number @ [phone] > this has been added to the order. ------  Notes Recorded by Osa Craver, CMA on 04/13/2016 at 9:48 AM EDT Spoke with TP and made her aware of results Per verbal order from TP Elevated D-Dimer Set up for CTA with PE protocol  LVM for pt to return call.

## 2016-04-13 NOTE — Progress Notes (Signed)
Pt's mother returned call (DPR on file).  Discussed results/recs as stated by TP with Mom.  CTA ordered STAT.  Mom did mention another appt this afternoon (she needs to know CTA time for transportation) and another contact number @ [phone] > this has been added to the order.

## 2016-04-13 NOTE — Progress Notes (Signed)
Spoke with TP and made her aware of results Per verbal order from TP Elevated D-Dimer Set up for CTA with PE protocol  LVM for pt to return call.

## 2016-04-14 ENCOUNTER — Telehealth: Payer: Self-pay | Admitting: Adult Health

## 2016-04-14 NOTE — Telephone Encounter (Signed)
D Dimer is only a screening tool  , is does not tell us if there is a clot or not It has a better predictive factor if negative.  Glad her CT chest was neg for PE.  Cont w/ ov recs Please contact office for sooner follow up if symptoms do not improve or worsen or seek emergency care

## 2016-04-14 NOTE — Telephone Encounter (Signed)
Patient's mom notified of Jessica Caldwell's recommendations. She asked why her daughter is so fatigued and wanted to know if she needed to increase her iron tablets now that the baby is here.  I advised her to contact her OB-GYN regarding that information since she just had a baby.  Nothing further needed.

## 2016-04-14 NOTE — Telephone Encounter (Signed)
Notes Recorded by Melvenia Needles, NP on 04/13/2016 at 6:52 PM EDT No PE or acute process-this is good news.  Cont w/ ov recs Please contact office for sooner follow up if symptoms do not improve or worsen or seek emergency care  ---  Called spoke with pt mother. She wants to know then why is pt feeling so tried and has no energy. Also why was the blood test positive and CT negative. Please advise TP thanks

## 2016-05-14 ENCOUNTER — Ambulatory Visit: Payer: Medicaid Other | Admitting: Internal Medicine

## 2016-05-21 ENCOUNTER — Other Ambulatory Visit: Payer: Self-pay | Admitting: Family Medicine

## 2016-07-03 IMAGING — DX DG CHEST 2V
2 series · 2 of 2 positions shown · non-contrast
Comparison: January 14, 2015

CLINICAL DATA: Chest pressure for 1 day

EXAM:
CHEST  2 VIEW

[chest pa]
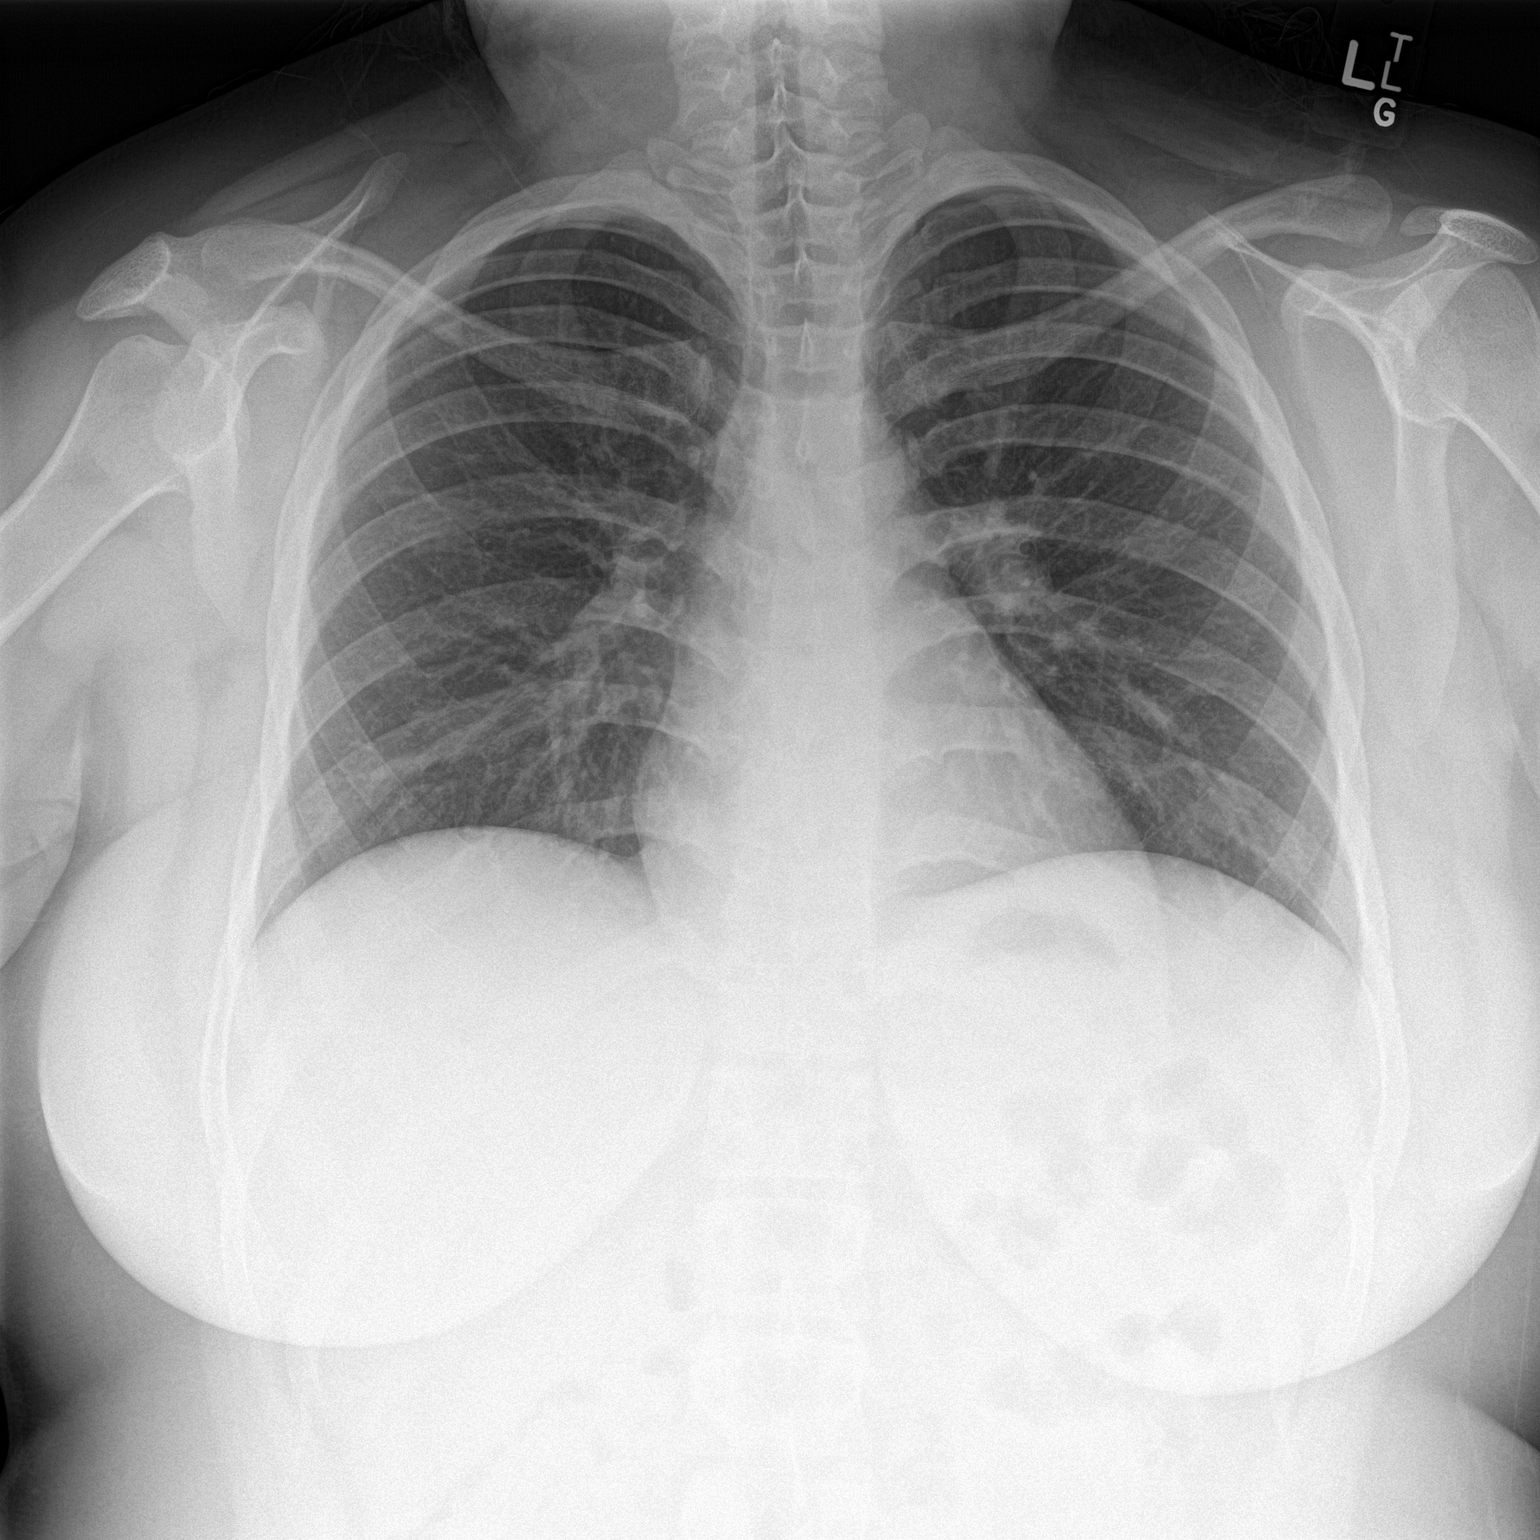

[chest lat]
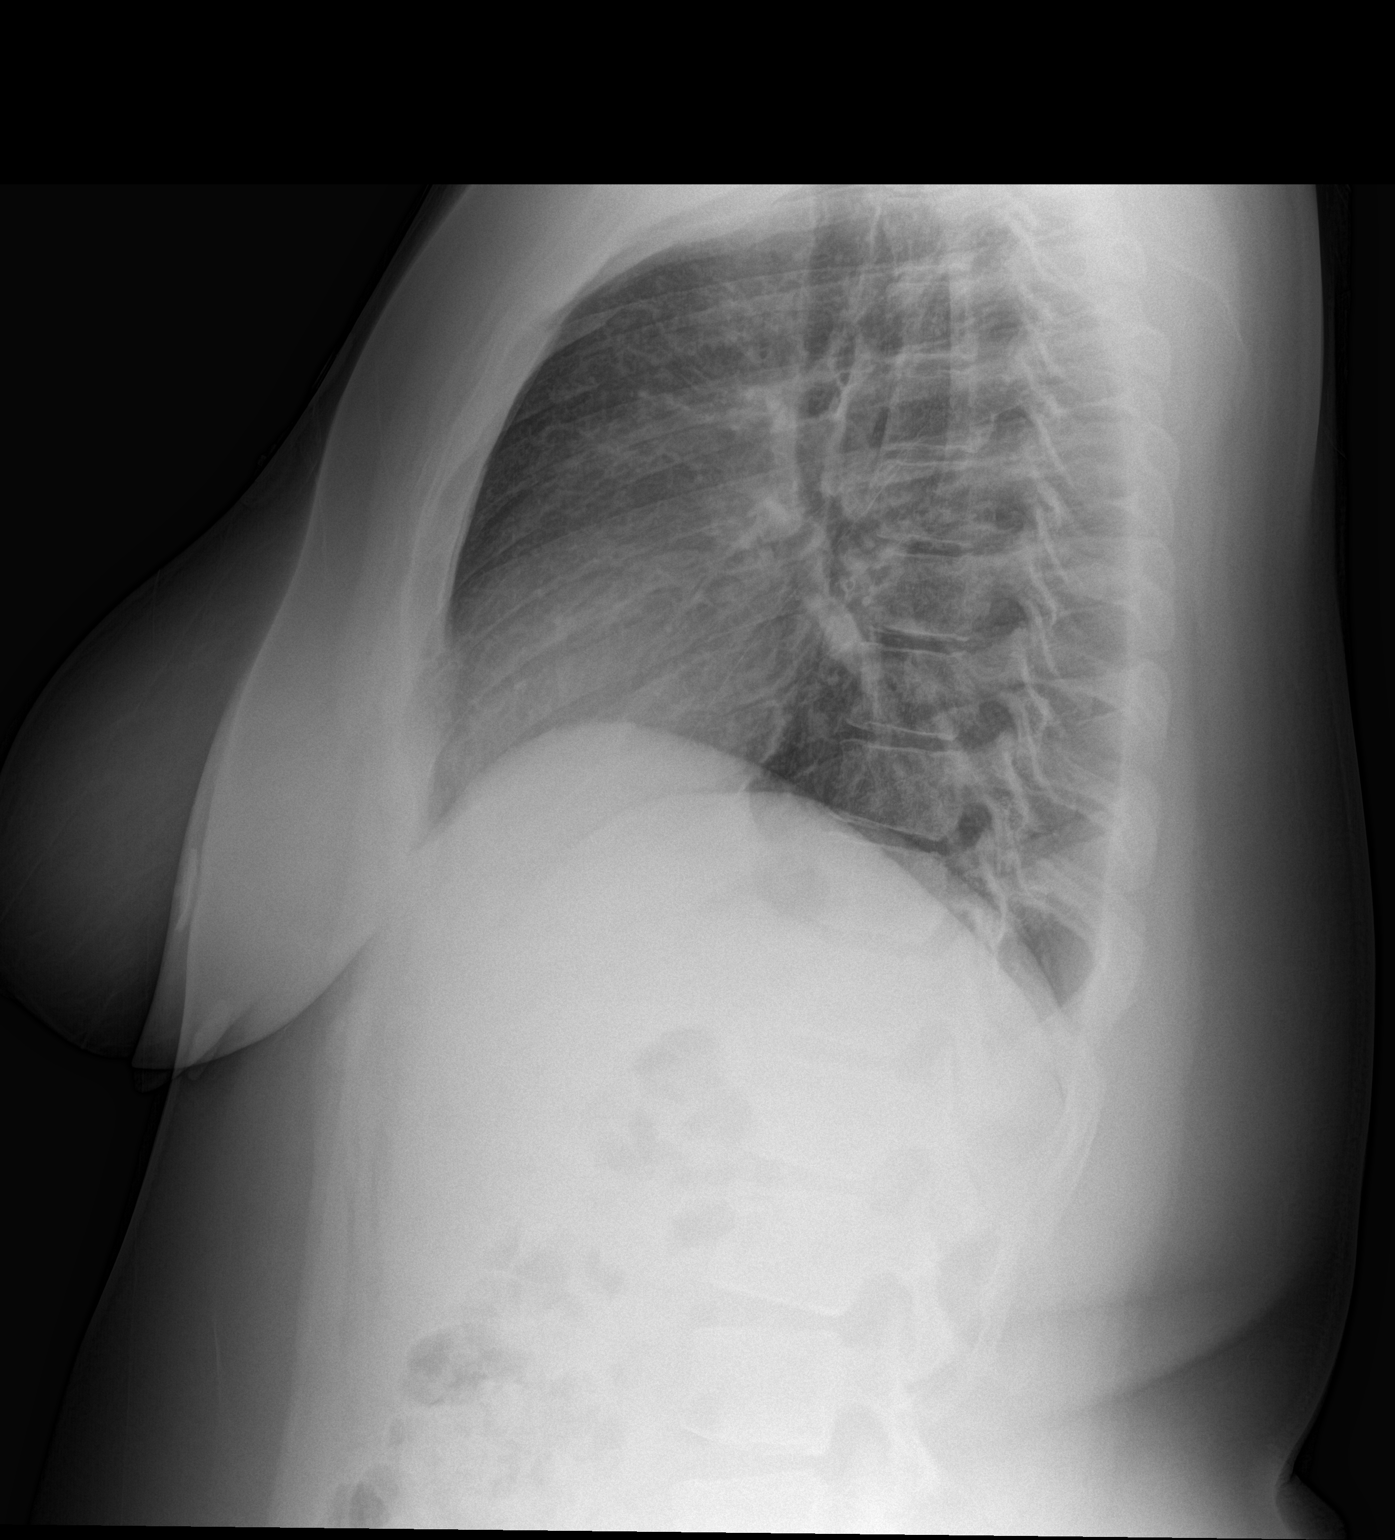

[2 of 2 positions shown; findings below may reference images not displayed]

FINDINGS: Lungs are clear. Heart size and pulmonary vascularity are normal. No
pneumothorax. No adenopathy. No bone lesions.
IMPRESSION: No abnormality noted.

## 2016-08-20 ENCOUNTER — Encounter (HOSPITAL_BASED_OUTPATIENT_CLINIC_OR_DEPARTMENT_OTHER): Payer: Self-pay | Admitting: *Deleted

## 2016-08-20 ENCOUNTER — Emergency Department (HOSPITAL_BASED_OUTPATIENT_CLINIC_OR_DEPARTMENT_OTHER)
Admission: EM | Admit: 2016-08-20 | Discharge: 2016-08-20 | Disposition: A | Discharge status: Home / Self Care | Payer: Medicaid Other | Attending: Emergency Medicine | Admitting: Emergency Medicine

## 2016-08-20 ENCOUNTER — Emergency Department (HOSPITAL_BASED_OUTPATIENT_CLINIC_OR_DEPARTMENT_OTHER): Payer: Medicaid Other

## 2016-08-20 DIAGNOSIS — E039 Hypothyroidism, unspecified: Secondary | ICD-10-CM | POA: Insufficient documentation

## 2016-08-20 DIAGNOSIS — R0789 Other chest pain: Secondary | ICD-10-CM | POA: Diagnosis not present

## 2016-08-20 DIAGNOSIS — J45909 Unspecified asthma, uncomplicated: Secondary | ICD-10-CM | POA: Diagnosis not present

## 2016-08-20 DIAGNOSIS — H65193 Other acute nonsuppurative otitis media, bilateral: Secondary | ICD-10-CM | POA: Diagnosis not present

## 2016-08-20 DIAGNOSIS — R509 Fever, unspecified: Secondary | ICD-10-CM | POA: Diagnosis present

## 2016-08-20 DIAGNOSIS — R69 Illness, unspecified: Secondary | ICD-10-CM

## 2016-08-20 DIAGNOSIS — Z79899 Other long term (current) drug therapy: Secondary | ICD-10-CM | POA: Diagnosis not present

## 2016-08-20 DIAGNOSIS — J111 Influenza due to unidentified influenza virus with other respiratory manifestations: Secondary | ICD-10-CM

## 2016-08-20 MED ORDER — CETIRIZINE-PSEUDOEPHEDRINE ER 5-120 MG PO TB12: 1.0000 | ORAL_TABLET | Freq: Two times a day (BID) | ORAL | 0 refills | Status: DC

## 2016-08-20 MED ORDER — ALBUTEROL SULFATE (2.5 MG/3ML) 0.083% IN NEBU
5.0000 mg | INHALATION_SOLUTION | Freq: Once | RESPIRATORY_TRACT | Status: AC
  Administered 2016-08-20: 5 mg via RESPIRATORY_TRACT
  Filled 2016-08-20: qty 6

## 2016-08-20 MED ORDER — ALBUTEROL SULFATE (2.5 MG/3ML) 0.083% IN NEBU: 2.5000 mg | INHALATION_SOLUTION | RESPIRATORY_TRACT | 0 refills | Status: DC | PRN

## 2016-08-20 MED ORDER — AMOXICILLIN 500 MG PO CAPS: 500.0000 mg | ORAL_CAPSULE | Freq: Three times a day (TID) | ORAL | 0 refills | Status: DC

## 2016-08-20 MED ORDER — BENZONATATE 100 MG PO CAPS: 100.0000 mg | ORAL_CAPSULE | Freq: Three times a day (TID) | ORAL | 0 refills | Status: DC

## 2016-08-20 NOTE — ED Provider Notes (Signed)
[facility] DEPT [facility] Provider Note   CSN: [id number] Arrival date & time: 08/20/16  1809  By signing my name below, I, Jessica Caldwell, attest that this documentation has been prepared under the direction and in the presence of Jessica Trice M. Delio Slates, PA-C. Electronically Signed: Vergia Caldwell, Scribe. 08/20/2016. 7:53 PM.  History   Chief Complaint Chief Complaint  Patient presents with  . Fever    The history is provided by the patient and a parent. No language interpreter was used.    HPI Comments:  Jessica Caldwell is a 21 y.o. female with a PMHx of Asthma and Hypothyroidism, who presents to the Emergency Department complaining of a persistent, productive cough with phlegm that began three days ago. Per her mother, the pt returned from a trip to [place] with her father three days ago at which time her symptoms began. She states associated symptoms of fever (Tmax 102), chills, chest tightness, congestion, rhinorrhea, and sensation of ear fullness. She reports her fiance contracted similar symptoms that are since improving. Per mother, they have used an albuterol inhaler and Tylenol at home with mild relief of her symptoms. No recent travel out of the country or [place] trips in a car. No recent surgeries. No new leg pain or swelling. She also denies nausea, vomiting, diarrhea, chest pain, abdominal pain, urgency, frequency, hematuria, dysuria, difficulty urinating, and any other associated symptoms at this time. She notes she did receive the seasonal influenza vaccination.   Past Medical History:  Diagnosis Date  . Anemia   . Asthma   . Hypothyroid   . IBS (irritable bowel syndrome)   . Pregnant     Patient Active Problem List   Diagnosis Date Noted  . Anemia affecting pregnancy in third trimester 12/17/2015  . History of asthma 10/01/2015  . Supervision of normal first pregnancy 09/02/2015  . Obesity in pregnancy 09/02/2015  . Morbid obesity ([place]) 01/16/2015  . Chronic cough 06/10/2013    . Moderate persistent asthma with exacerbation 05/26/2013  . Hemoptysis 05/26/2013  . Easy bruising 05/26/2013  . Pleurisy 05/26/2013  . Acquired hypothyroidism 06/01/2011    Past Surgical History:  Procedure Laterality Date  . KNEE SURGERY    . TONSILLECTOMY    . VIDEO BRONCHOSCOPY Bilateral 06/29/2013   Procedure: VIDEO BRONCHOSCOPY WITHOUT FLUORO;  Surgeon: Jessica Males, MD;  Location: [facility] ENDOSCOPY;  Service: Cardiopulmonary;  Laterality: Bilateral;    OB History    Gravida Para Term Preterm AB Living   1             SAB TAB Ectopic Multiple Live Births                   Home Medications    Prior to Admission medications   Medication Sig Start Date End Date Taking? Authorizing Provider  albuterol (PROAIR HFA) 108 (90 BASE) MCG/ACT inhaler INHALE TWO PUFFS BY MOUTH EVERY 4 HOURS AS NEEDED FOR WHEEZING OR FOR SHORTNESS OF BREATH 05/08/15  Yes Jessica Minium, MD  budesonide-formoterol (SYMBICORT) 80-4.5 MCG/ACT inhaler Inhale 2 puffs into the lungs 2 (two) times daily. 04/10/16  Yes Jessica S Parrett, NP  ferrous sulfate 325 (65 FE) MG tablet Take 1 tablet (325 mg total) by mouth 2 (two) times daily with a meal. 12/18/15  Yes Jessica Drafts, MD  levothyroxine (SYNTHROID, LEVOTHROID) 50 MCG tablet TAKE ONE TABLET BY MOUTH ONCE DAILY 12/18/15  Yes Jessica Minium, MD  loratadine (CLARITIN) 10 MG tablet Take 10 mg by  mouth daily.   Yes Historical Provider, MD  montelukast (SINGULAIR) 10 MG tablet Take 1 tablet (10 mg total) by mouth at bedtime. 05/08/15  Yes Jessica Minium, MD  albuterol (PROVENTIL) (2.5 MG/3ML) 0.083% nebulizer solution Take 3-6 mLs (2.5-5 mg total) by nebulization every 4 (four) hours as needed for wheezing or shortness of breath. 08/20/16   Jessica Kuster, PA-C  amoxicillin (AMOXIL) 500 MG capsule Take 1 capsule (500 mg total) by mouth 3 (three) times daily. 08/20/16   Jessica Kuster, PA-C  benzonatate (TESSALON) 100 MG capsule Take 1 capsule  (100 mg total) by mouth every 8 (eight) hours. 08/20/16   Jessica Urieta M Azul Brumett, PA-C  cetirizine-pseudoephedrine (ZYRTEC-D) 5-120 MG tablet Take 1 tablet by mouth 2 (two) times daily. 08/20/16   Jessica Kuster, PA-C    Family History Family History  Problem Relation Age of Onset  . Asthma Paternal Grandfather   . Heart disease Paternal Grandfather   . Cancer Paternal Grandfather     and several uncles.   . Asthma Paternal Grandmother   . Clotting disorder Paternal Grandmother   . Rheum arthritis Paternal Grandmother   . Heart disease Maternal Grandfather   . Heart disease Paternal Uncle     Social History Social History  Substance Use Topics  . Smoking status: Never Smoker  . Smokeless tobacco: Never Used  . Alcohol use No     Allergies   Azithromycin; Augmentin [amoxicillin-pot clavulanate]; Oxycodone-acetaminophen; Penicillins; Codeine; and Sulfa antibiotics   Review of Systems Review of Systems  Constitutional: Positive for chills and fever.  HENT: Positive for congestion and rhinorrhea. Negative for facial swelling and sore throat.        + Ear fullness  Respiratory: Positive for cough and chest tightness. Negative for shortness of breath.   Cardiovascular: Negative for chest pain.  Gastrointestinal: Negative for abdominal pain, diarrhea, nausea and vomiting.  Genitourinary: Negative for difficulty urinating, dysuria, frequency, hematuria and urgency.  Musculoskeletal: Negative for back pain.  Skin: Negative for rash and wound.  Neurological: Negative for headaches.  Psychiatric/Behavioral: The patient is not nervous/anxious.      Physical Exam Updated Vital Signs BP 120/75 (BP Location: Right Arm)   Pulse 99   Temp 98.3 F (36.8 C) (Oral)   Resp 18   Ht 5\' 4"  (1.626 m)   Wt 113.4 kg   LMP 08/06/2016   SpO2 100%   BMI 42.91 kg/m   Physical Exam  Constitutional: She appears well-developed and well-nourished. No distress.  HENT:  Head: Normocephalic and  atraumatic.  Right Ear: Tympanic membrane is injected and erythematous. A middle ear effusion is present.  Left Ear: Tympanic membrane is injected and erythematous. A middle ear effusion is present.  Mouth/Throat: Oropharynx is clear and moist. No oropharyngeal exudate.  Eyes: Conjunctivae are normal. Pupils are equal, round, and reactive to light. Right eye exhibits no discharge. Left eye exhibits no discharge. No scleral icterus.  Neck: Normal range of motion. Neck supple. No thyromegaly present.  Cardiovascular: Normal rate, regular rhythm, normal heart sounds and intact distal pulses.  Exam reveals no gallop and no friction rub.   No murmur heard. Pulmonary/Chest: Effort normal and breath sounds normal. No stridor. No respiratory distress. She has no wheezes. She has no rales.  Abdominal: Soft. Bowel sounds are normal. She exhibits no distension. There is no tenderness. There is no rebound and no guarding.  Musculoskeletal: She exhibits no edema.  Lymphadenopathy:    She has no  cervical adenopathy.  Neurological: She is alert. Coordination normal.  Skin: Skin is warm and dry. No rash noted. She is not diaphoretic. No pallor.  Psychiatric: She has a normal mood and affect.  Nursing note and vitals reviewed.   ED Treatments / Results  DIAGNOSTIC STUDIES:  Oxygen Saturation is 100% on RA, normal by my interpretation.    COORDINATION OF CARE:  7:44 PM Discussed treatment plan with pt at bedside including Abx and Tessalon and pt agreed to plan.  Labs (all labs ordered are listed, but only abnormal results are displayed) Labs Reviewed - No data to display  EKG  EKG Interpretation None       Radiology Dg Chest 2 View  Result Date: 08/20/2016 CLINICAL DATA:  Dyspnea.  Fever.  Cough. EXAM: CHEST  2 VIEW COMPARISON:  06/25/2015 chest radiograph. FINDINGS: Stable cardiomediastinal silhouette with normal heart size. No pneumothorax. No pleural effusion. Lungs appear clear, with no  acute consolidative airspace disease and no pulmonary edema. IMPRESSION: No active cardiopulmonary disease. Electronically Signed   By: Ilona Sorrel M.D.   On: 08/20/2016 18:50    Procedures Procedures (including critical care time)  Medications Ordered in ED Medications  albuterol (PROVENTIL) (2.5 MG/3ML) 0.083% nebulizer solution 5 mg (5 mg Nebulization Given 08/20/16 1828)     Initial Impression / Assessment and Plan / ED Course  I have reviewed the triage vital signs and the nursing notes.  Pertinent labs & imaging results that were available during my care of the patient were reviewed by me and considered in my medical decision making (see chart for details).     Patient with symptoms consistent with influenza, however will cover with amoxicillin for otitis media considering appearance.  Vitals are stable, no fever in the ED.  No signs of dehydration, tolerating PO's. CXR shows no active cardiopulmonary disease. Patient is out of window of treatment for Tamiflu. Patient will be discharged with instructions to orally hydrate, rest, and use over-the-counter medications such as anti-inflammatories ibuprofen and Aleve for muscle aches and Tylenol for fever.  Patient will also be given a cough suppressant, refill of albuterol nebulizer solution, Zyrtec-D. Return precautions discussed. Patient understands and agrees with plan. Patient vitals stable throughout ED course and discharged in satisfactory condition.   Final Clinical Impressions(s) / ED Diagnoses   Final diagnoses:  Influenza-like illness    New Prescriptions New Prescriptions   AMOXICILLIN (AMOXIL) 500 MG CAPSULE    Take 1 capsule (500 mg total) by mouth 3 (three) times daily.   BENZONATATE (TESSALON) 100 MG CAPSULE    Take 1 capsule (100 mg total) by mouth every 8 (eight) hours.   CETIRIZINE-PSEUDOEPHEDRINE (ZYRTEC-D) 5-120 MG TABLET    Take 1 tablet by mouth 2 (two) times daily.   I personally performed the services  described in this documentation, which was scribed in my presence. The recorded information has been reviewed and is accurate.     Jessica Kuster, PA-C 08/20/16 2006    Malvin Johns, MD 08/20/16 2303

## 2016-08-20 NOTE — Discharge Instructions (Signed)
Medications: Tessalon, albuterol solution, Zyrtec-D, amoxicillin  Treatment: Take Tessalon every 8 hours as needed for cough. Use albuterol every 4 hours as prescribed for wheezing or shortness of breath. Take Zyrtec-D twice daily for nasal congestion. Take amoxicillin twice daily for 1 week to cover for ear infection.  Follow-up: Please follow-up with your primary care provider if your symptoms are not improving. Please return to the emergency department if you develop any new or worsening symptoms.

## 2016-08-20 NOTE — ED Triage Notes (Signed)
C/o feeling bad since Monday had fever as high 102.1. Sore throat and sob.

## 2016-08-20 NOTE — ED Notes (Signed)
Has a allergy to PCN but takes amoxicillin without any problem.

## 2016-08-20 NOTE — ED Notes (Signed)
ED Provider at bedside. 

## 2016-08-20 NOTE — ED Notes (Signed)
Given po fluids 

## 2016-11-12 IMAGING — CR DG CHEST 2V
2 series · 2 of 2 positions shown · non-contrast
Comparison: 05/16/2015

CLINICAL DATA: Shortness of breath for 3 days. Bilateral rib and
chest pain. History of asthma.

EXAM:
CHEST  2 VIEW

[w chest pa]
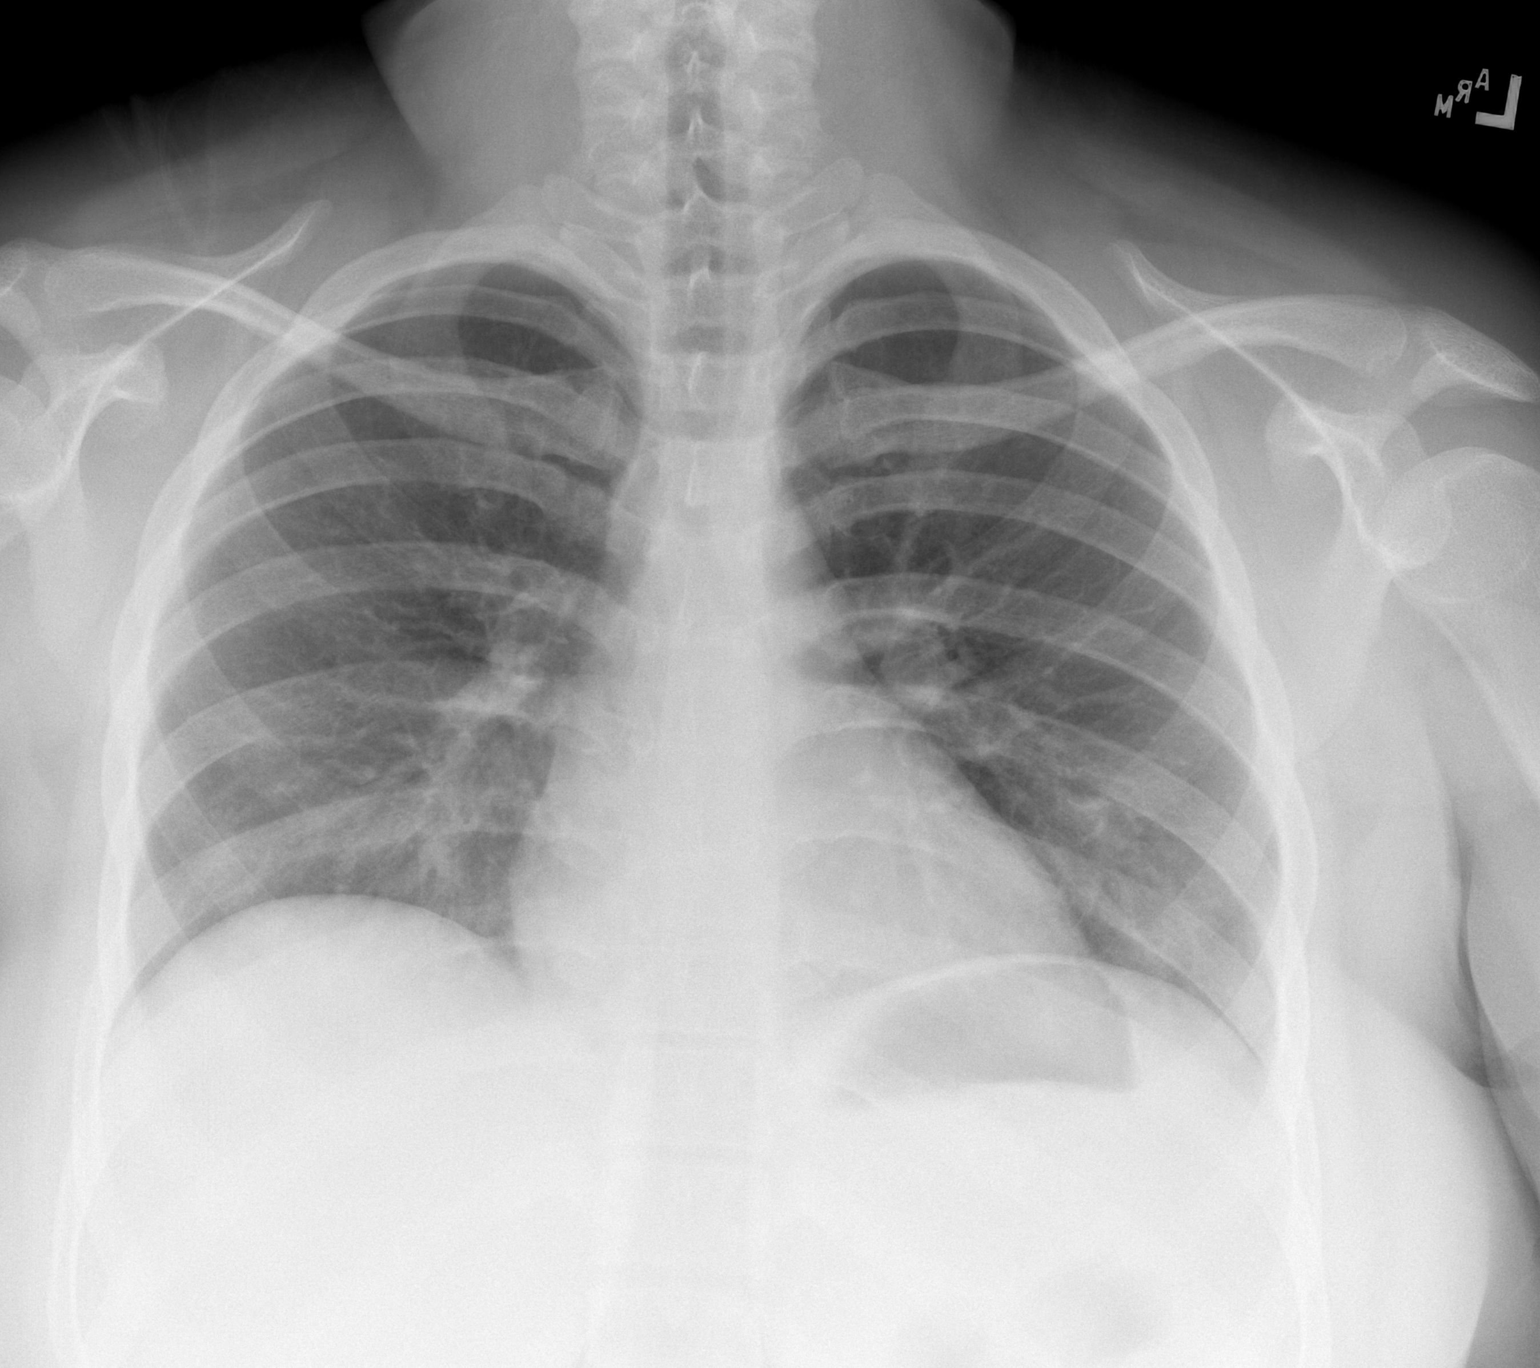

[w chest lat]
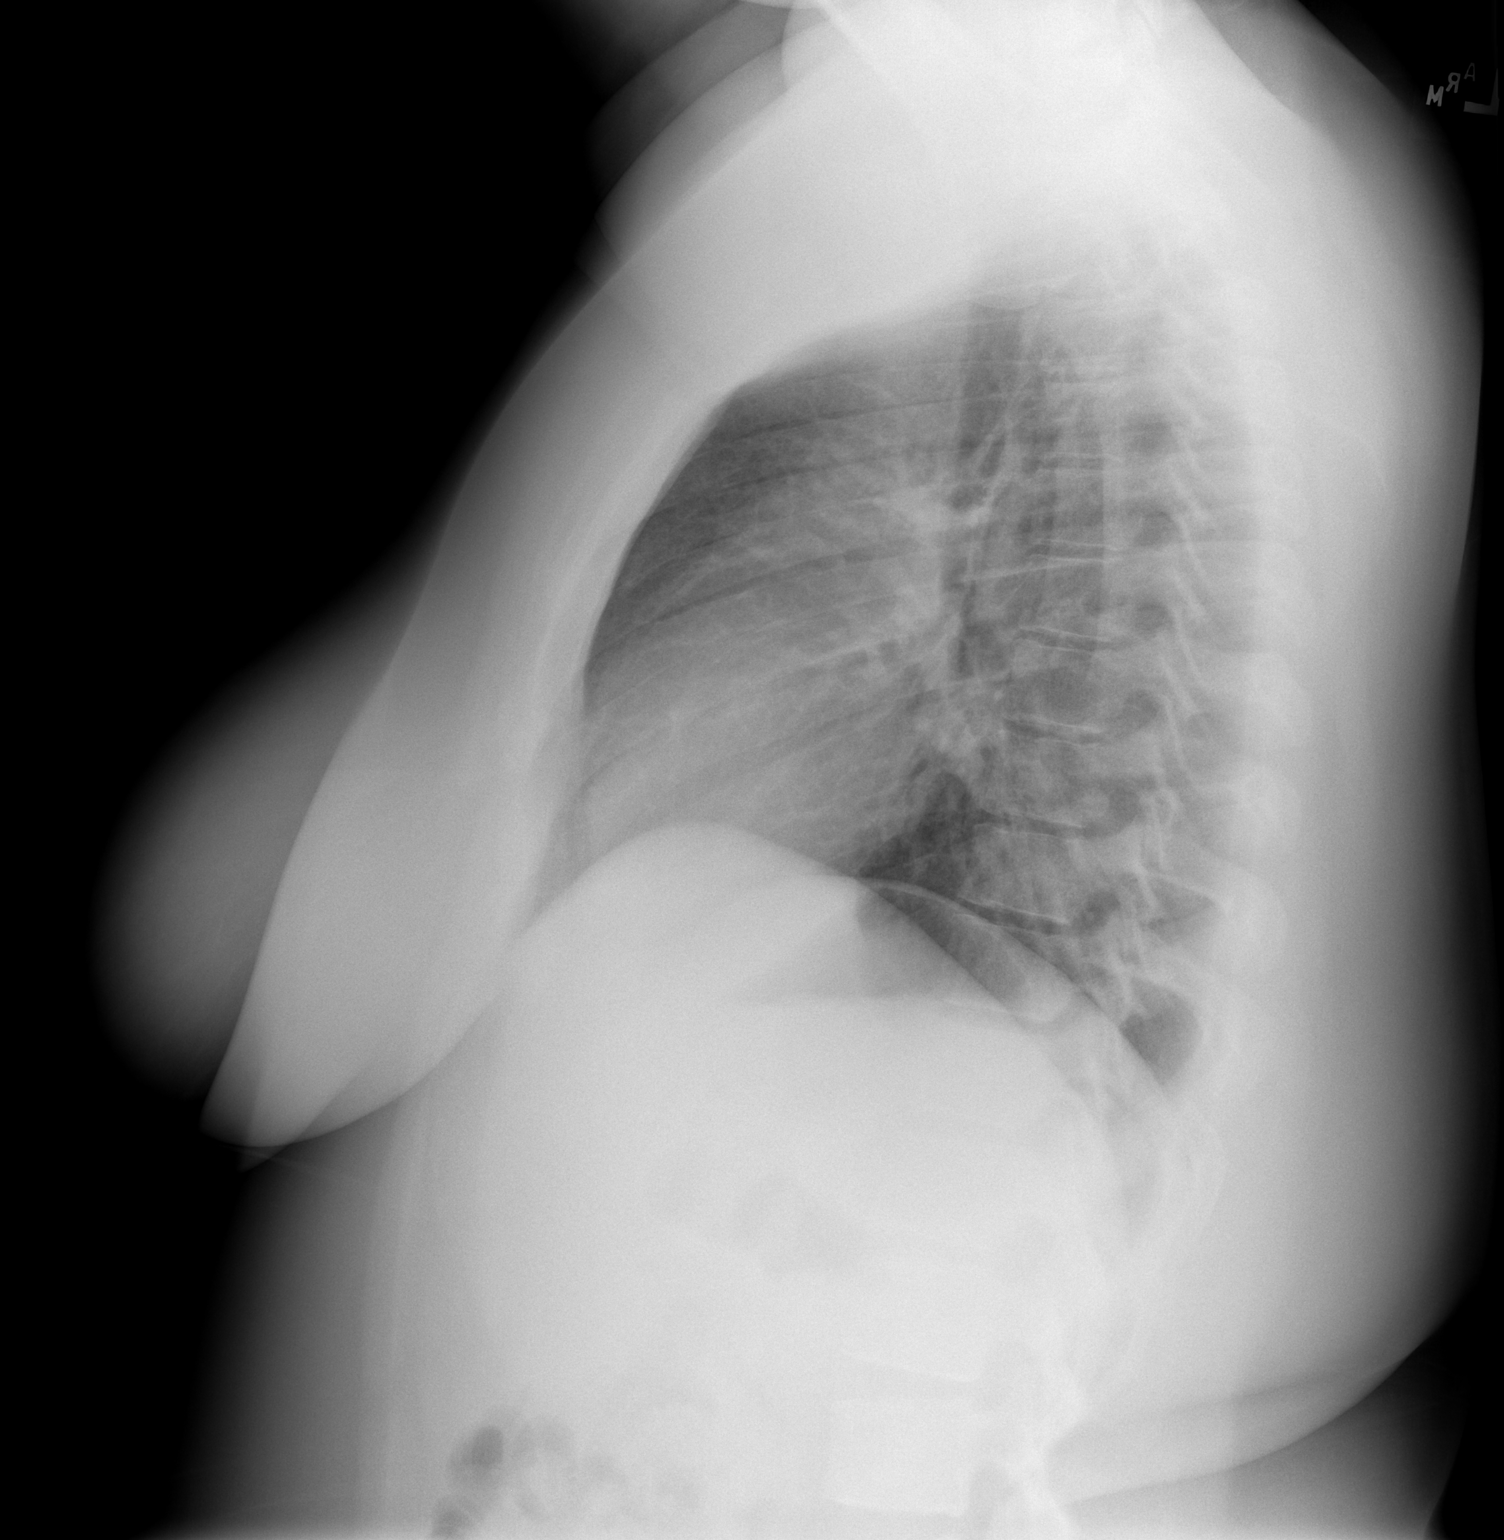

[2 of 2 positions shown; findings below may reference images not displayed]

FINDINGS: Cardiomediastinal silhouette is normal. Mediastinal contours appear
intact.

There is no evidence of focal airspace consolidation, pleural
effusion or pneumothorax.

Osseous structures are without acute abnormality. Soft tissues are
grossly normal.
IMPRESSION: No active cardiopulmonary disease.

## 2016-11-15 IMAGING — DX DG CHEST 2V
2 series · 2 of 2 positions shown · non-contrast
Comparison: Chest radiograph performed 06/22/2015

CLINICAL DATA: Acute onset of cough and fever.  Initial encounter.

EXAM:
CHEST  2 VIEW

[chest pa]
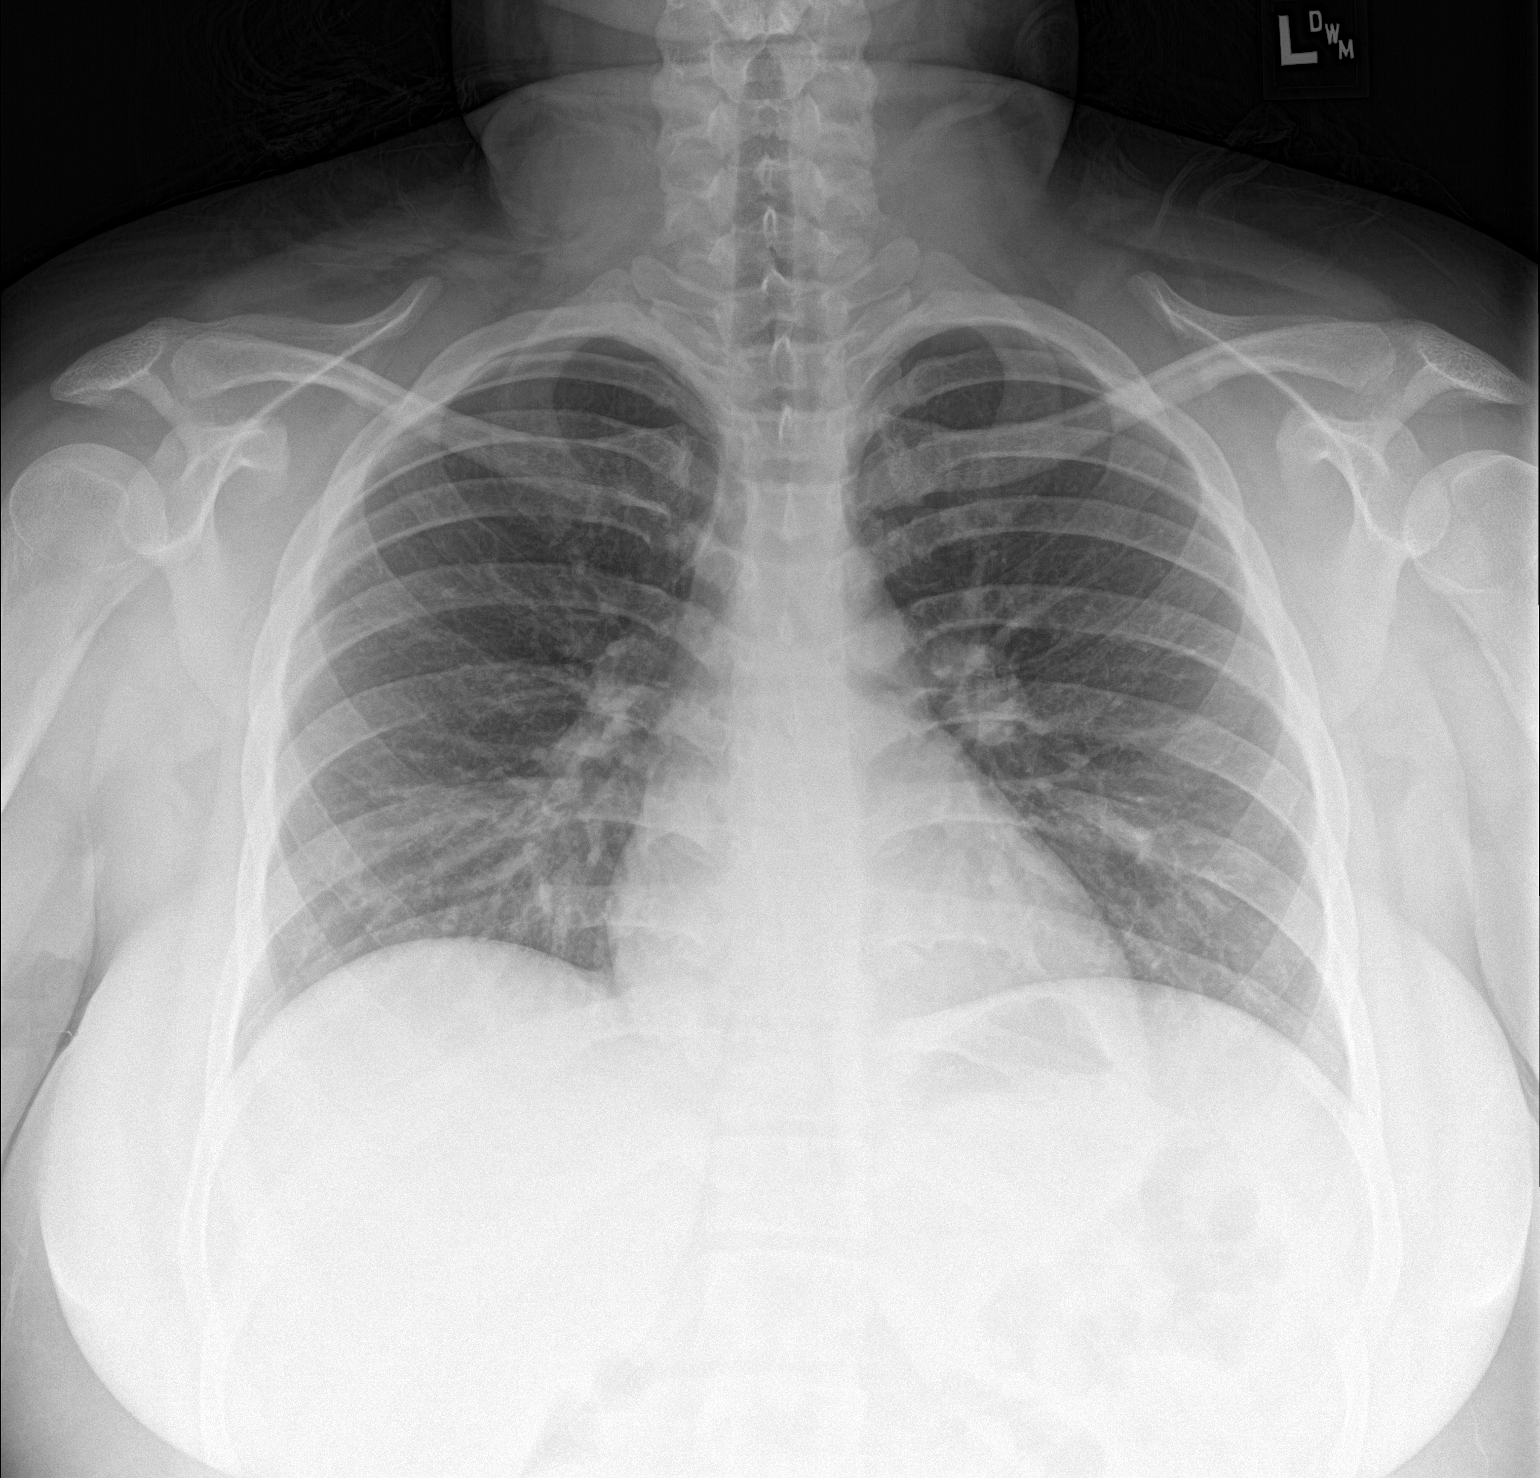

[chest lat]
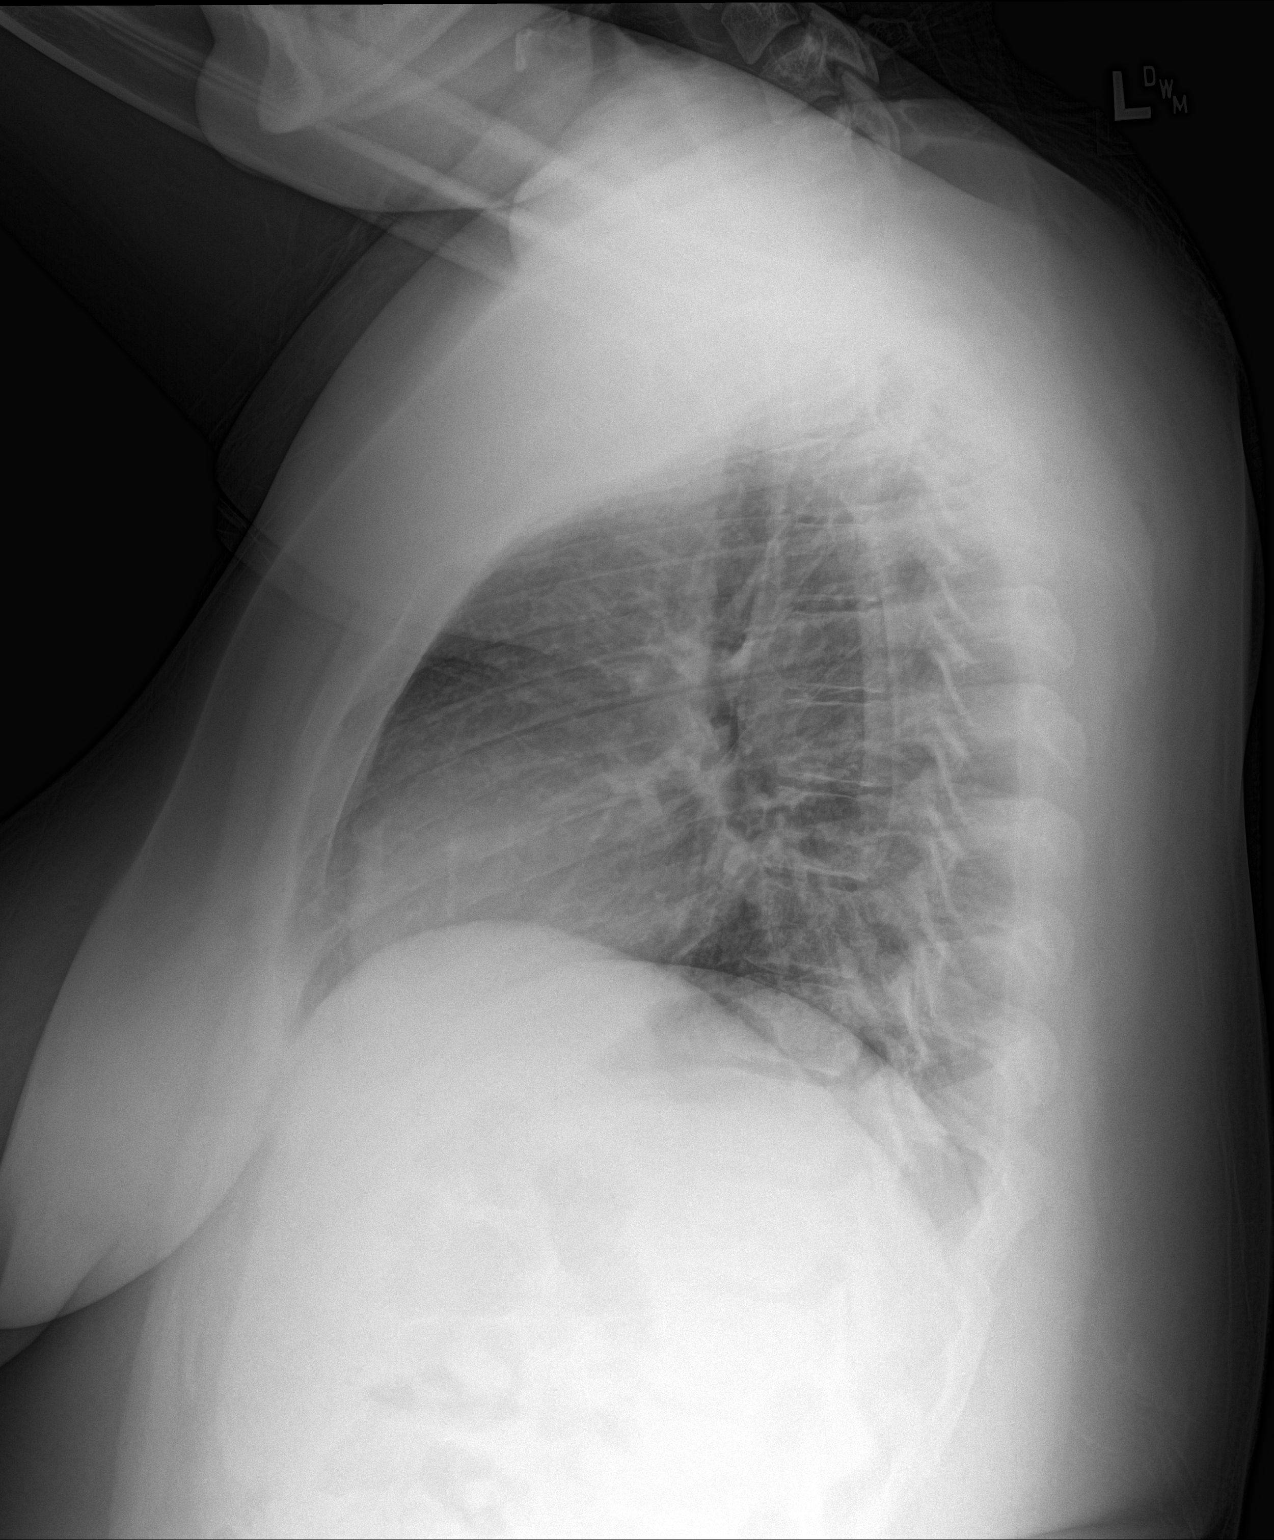

[2 of 2 positions shown; findings below may reference images not displayed]

FINDINGS: The lungs are well-aerated. Mild bibasilar atelectasis is noted.
There is no evidence of pleural effusion or pneumothorax.

The heart is normal in size; the mediastinal contour is within
normal limits. No acute osseous abnormalities are seen.
IMPRESSION: Mild bibasilar atelectasis noted.  Lungs otherwise clear.

## 2017-01-06 ENCOUNTER — Ambulatory Visit: Payer: Medicaid Other | Admitting: Internal Medicine

## 2017-01-07 ENCOUNTER — Ambulatory Visit: Payer: Medicaid Other | Admitting: Adult Health

## 2017-01-08 ENCOUNTER — Ambulatory Visit: Payer: Medicaid Other | Admitting: Internal Medicine

## 2017-01-11 ENCOUNTER — Ambulatory Visit: Payer: Medicaid Other | Admitting: Internal Medicine

## 2017-01-20 ENCOUNTER — Encounter (HOSPITAL_BASED_OUTPATIENT_CLINIC_OR_DEPARTMENT_OTHER): Payer: Self-pay | Admitting: *Deleted

## 2017-01-20 ENCOUNTER — Emergency Department (HOSPITAL_BASED_OUTPATIENT_CLINIC_OR_DEPARTMENT_OTHER)
Admission: EM | Admit: 2017-01-20 | Discharge: 2017-01-20 | Disposition: A | Discharge status: Home / Self Care | Payer: Medicaid Other | Attending: Emergency Medicine | Admitting: Emergency Medicine

## 2017-01-20 DIAGNOSIS — H9203 Otalgia, bilateral: Secondary | ICD-10-CM | POA: Diagnosis present

## 2017-01-20 DIAGNOSIS — J45909 Unspecified asthma, uncomplicated: Secondary | ICD-10-CM | POA: Insufficient documentation

## 2017-01-20 DIAGNOSIS — H60333 Swimmer's ear, bilateral: Secondary | ICD-10-CM | POA: Insufficient documentation

## 2017-01-20 DIAGNOSIS — E039 Hypothyroidism, unspecified: Secondary | ICD-10-CM | POA: Insufficient documentation

## 2017-01-20 DIAGNOSIS — Z79899 Other long term (current) drug therapy: Secondary | ICD-10-CM | POA: Insufficient documentation

## 2017-01-20 MED ORDER — NEOMYCIN-POLYMYXIN-HC 3.5-10000-1 OT SUSP: 4.0000 [drp] | Freq: Three times a day (TID) | OTIC | 0 refills | Status: AC

## 2017-01-20 MED ORDER — ACETAMINOPHEN 500 MG PO TABS
1000.0000 mg | ORAL_TABLET | Freq: Once | ORAL | Status: AC
Start: 2017-01-20 — End: 2017-01-20
  Administered 2017-01-20: 1000 mg via ORAL
  Filled 2017-01-20: qty 2

## 2017-01-20 MED ORDER — NEOMYCIN-POLYMYXIN-HC 3.5-10000-1 OT SUSP: 4.0000 [drp] | Freq: Three times a day (TID) | OTIC | 0 refills | Status: DC

## 2017-01-20 NOTE — ED Provider Notes (Signed)
[facility] DEPT [facility] Provider Note   CSN: [id number] Arrival date & time: 01/20/17  1912  By signing my name below, I, Jessica Caldwell, attest that this documentation has been prepared under the direction and in the presence of [facility]. Electronically Signed: Reola Caldwell, ED Scribe. 01/20/17. 9:30 PM.  History   Chief Complaint Chief Complaint  Patient presents with  . Otalgia   The history is provided by the patient. No language interpreter was used.    HPI Comments: Jessica Caldwell is a 21 y.o. female who presents to the Emergency Department complaining of persistent, gradually worsening bilateral ear pain (L>R) beginning two days ago.She notes radiation of her pain behind her ear. Per pt, her ear pain began following an episode of swimming. She notes associated fever (febrile in the ED at 101.3) and fatigue since the onset of her ear pain. Pt has been taking Tylenol at home without significant relief of her symptoms. Pt does have a h/o recurrent ear infections and she notes that her pain today is worse than her typical pain associated with this. Pt has previously had tympanostomy tubes placed bilaterally. She denies hearing changes, sneezing, coughing, congestion, headache, shortness of breath, abdominal pain, nausea, vomiting, or any other associated symptoms.   Past Medical History:  Diagnosis Date  . Anemia   . Asthma   . Hypothyroid   . IBS (irritable bowel syndrome)    Patient Active Problem List   Diagnosis Date Noted  . Anemia affecting pregnancy in third trimester 12/17/2015  . History of asthma 10/01/2015  . Supervision of normal first pregnancy 09/02/2015  . Obesity in pregnancy 09/02/2015  . Morbid obesity ([place]) 01/16/2015  . Chronic cough 06/10/2013  . Moderate persistent asthma with exacerbation 05/26/2013  . Hemoptysis 05/26/2013  . Easy bruising 05/26/2013  . Pleurisy 05/26/2013  . Acquired hypothyroidism 06/01/2011   Past Surgical  History:  Procedure Laterality Date  . KNEE SURGERY    . TONSILLECTOMY    . VIDEO BRONCHOSCOPY Bilateral 06/29/2013   Procedure: VIDEO BRONCHOSCOPY WITHOUT FLUORO;  Surgeon: Brand Males, MD;  Location: [facility] ENDOSCOPY;  Service: Cardiopulmonary;  Laterality: Bilateral;   OB History    Gravida Para Term Preterm AB Living   1             SAB TAB Ectopic Multiple Live Births                 Home Medications    Prior to Admission medications   Medication Sig Start Date End Date Taking? Authorizing Provider  albuterol (PROAIR HFA) 108 (90 BASE) MCG/ACT inhaler INHALE TWO PUFFS BY MOUTH EVERY 4 HOURS AS NEEDED FOR WHEEZING OR FOR SHORTNESS OF BREATH 05/08/15   Midge Minium, MD  albuterol (PROVENTIL) (2.5 MG/3ML) 0.083% nebulizer solution Take 3-6 mLs (2.5-5 mg total) by nebulization every 4 (four) hours as needed for wheezing or shortness of breath. 08/20/16   Frederica Kuster, PA-C  amoxicillin (AMOXIL) 500 MG capsule Take 1 capsule (500 mg total) by mouth 3 (three) times daily. 08/20/16   Law, Bea Graff, PA-C  benzonatate (TESSALON) 100 MG capsule Take 1 capsule (100 mg total) by mouth every 8 (eight) hours. 08/20/16   Law, Bea Graff, PA-C  budesonide-formoterol (SYMBICORT) 80-4.5 MCG/ACT inhaler Inhale 2 puffs into the lungs 2 (two) times daily. 04/10/16   Parrett, Fonnie Mu, NP  cetirizine-pseudoephedrine (ZYRTEC-D) 5-120 MG tablet Take 1 tablet by mouth 2 (two) times daily. 08/20/16  Frederica Kuster, PA-C  ferrous sulfate 325 (65 FE) MG tablet Take 1 tablet (325 mg total) by mouth 2 (two) times daily with a meal. 12/18/15   Lavonia Drafts, MD  levothyroxine (SYNTHROID, LEVOTHROID) 50 MCG tablet TAKE ONE TABLET BY MOUTH ONCE DAILY 12/18/15   Midge Minium, MD  loratadine (CLARITIN) 10 MG tablet Take 10 mg by mouth daily.    [provider]  montelukast (SINGULAIR) 10 MG tablet Take 1 tablet (10 mg total) by mouth at bedtime. 05/08/15   Midge Minium, MD    neomycin-polymyxin-hydrocortisone (CORTISPORIN) 3.5-10000-1 OTIC suspension Place 4 drops into both ears 3 (three) times daily. 01/20/17 01/27/17  Renita Papa, PA-C   Family History Family History  Problem Relation Age of Onset  . Asthma Paternal Grandfather   . Heart disease Paternal Grandfather   . Cancer Paternal Grandfather        and several uncles.   . Asthma Paternal Grandmother   . Clotting disorder Paternal Grandmother   . Rheum arthritis Paternal Grandmother   . Heart disease Maternal Grandfather   . Heart disease Paternal Uncle    Social History Social History  Substance Use Topics  . Smoking status: Never Smoker  . Smokeless tobacco: Never Used  . Alcohol use No   Allergies   Azithromycin; Augmentin [amoxicillin-pot clavulanate]; Oxycodone-acetaminophen; Penicillins; Codeine; and Sulfa antibiotics  Review of Systems Review of Systems  Constitutional: Positive for fatigue and fever.  HENT: Positive for ear pain. Negative for congestion, hearing loss and rhinorrhea.   Respiratory: Negative for cough and shortness of breath.   Gastrointestinal: Negative for abdominal pain, nausea and vomiting.  All other systems reviewed and are negative.  Physical Exam Updated Vital Signs BP 112/73   Pulse 97   Temp 99.9 F (37.7 C) (Oral)   Resp 18   Ht 5\' 4"  (1.626 m)   Wt 116.1 kg (256 lb)   LMP 12/30/2016   SpO2 97%   BMI 43.94 kg/m   Physical Exam  Constitutional: She appears well-developed and well-nourished. No distress.  HENT:  Head: Normocephalic and atraumatic.  Right Ear: There is tenderness. No mastoid tenderness. Tympanic membrane is scarred. No hemotympanum.  Left Ear: There is drainage, swelling and tenderness. No mastoid tenderness. No hemotympanum.  Nose: Nose normal. Right sinus exhibits no maxillary sinus tenderness and no frontal sinus tenderness. Left sinus exhibits no maxillary sinus tenderness and no frontal sinus tenderness.  Mouth/Throat: Uvula  is midline, oropharynx is clear and moist and mucous membranes are normal.  Left ear with tenderness to palpation of the tragus and mild pain elicited on tugging of the pinna. There is yellow-white discharge in the ear canal, pain elicited on insertion of the speculum. TM unable to be visualized. Right ear with mild tenderness to palpation of the tragus. Small amount of white drainage in the ear canal, TM scarred but without bulging or erythema. No tenderness to palpation, erythema, swelling, or crepitus of the mastoids bilaterally  Eyes: Conjunctivae are normal. Right eye exhibits no discharge. Left eye exhibits no discharge.  Neck: No JVD present. No tracheal deviation present.  Cardiovascular: Normal rate.   Pulmonary/Chest: Effort normal.  Abdominal: She exhibits no distension.  Musculoskeletal: She exhibits no edema.  Lymphadenopathy:    She has no cervical adenopathy.  Neurological: She is alert.  Skin: No erythema.  Psychiatric: She has a normal mood and affect. Her behavior is normal.  Nursing note and vitals reviewed.  ED Treatments /  Results  DIAGNOSTIC STUDIES: Oxygen Saturation is 97% on RA, normal by my interpretation.   COORDINATION OF CARE: 9:30 PM-Discussed next steps with pt. Pt verbalized understanding and is agreeable with the plan.   Labs (all labs ordered are listed, but only abnormal results are displayed) Labs Reviewed - No data to display  EKG  EKG Interpretation None      Radiology No results found.  Procedures Procedures   Medications Ordered in ED Medications  acetaminophen (TYLENOL) tablet 1,000 mg (1,000 mg Oral Given 01/20/17 2059)   Initial Impression / Assessment and Plan / ED Course  I have reviewed the triage vital signs and the nursing notes.  Pertinent labs & imaging results that were available during my care of the patient were reviewed by me and considered in my medical decision making (see chart for details).       Pt presenting  with otitis externa after swimming. No canal occlusion, Pt febrile at 101.87F which was well controlled with in NAD. Exam non concerning for mastoiditis, cellulitis or malignant OE. Discharge with cortisporin script.  Advised of PCP follow up in 2-3 days if no improvement with treatment or no complete resolution by 7 days. Discussed indications for return to the ED. Pt verbalized understanding of and agreement with plan and is safe for discharge home at this time.   Final Clinical Impressions(s) / ED Diagnoses   Final diagnoses:  Acute swimmer's ear of both sides   New Prescriptions Discharge Medication List as of 01/20/2017  9:39 PM    START taking these medications   Details  neomycin-polymyxin-hydrocortisone (CORTISPORIN) 3.5-10000-1 OTIC suspension Place 4 drops into both ears 3 (three) times daily., Starting Wed 01/20/2017, Until Wed 01/27/2017, Print      I personally performed the services described in this documentation, which was scribed in my presence. The recorded information has been reviewed and is accurate.     Renita Papa, PA-C 01/21/17 [id number]    Fredia Sorrow, MD 01/28/17 [id number]

## 2017-01-20 NOTE — Discharge Instructions (Signed)
Please take all of your antibiotics until finished! Follow up with your primary care in the next 3-4 days for reevaluation. Return to the ED if any concerning signs or symptoms develop such as fever not controlled by Tylenol or Motrin, or redness or swelling to the mastoids.

## 2017-01-20 NOTE — ED Notes (Signed)
Pt verbalizes understanding of d/c instructions and denies any further needs at this time. 

## 2017-01-20 NOTE — ED Notes (Signed)
EDP at bedside now. °

## 2017-01-20 NOTE — ED Triage Notes (Signed)
Left ear pain x 2 days

## 2017-01-20 NOTE — ED Notes (Signed)
Bilateral ear pain for two days, pt has been swimming a lot, took 1000mg  tylenol at 1600

## 2017-01-22 ENCOUNTER — Ambulatory Visit: Payer: Medicaid Other | Admitting: Internal Medicine

## 2017-01-26 ENCOUNTER — Ambulatory Visit (INDEPENDENT_AMBULATORY_CARE_PROVIDER_SITE_OTHER): Payer: Medicaid Other | Admitting: Internal Medicine

## 2017-01-26 ENCOUNTER — Encounter: Payer: Self-pay | Admitting: Internal Medicine

## 2017-01-26 ENCOUNTER — Other Ambulatory Visit: Payer: Medicaid Other

## 2017-01-26 VITALS — BP 112/78 | HR 77 | Ht 64.0 in | Wt 255.2 lb

## 2017-01-26 DIAGNOSIS — J4530 Mild persistent asthma, uncomplicated: Secondary | ICD-10-CM | POA: Diagnosis not present

## 2017-01-26 DIAGNOSIS — J4541 Moderate persistent asthma with (acute) exacerbation: Secondary | ICD-10-CM

## 2017-01-26 DIAGNOSIS — R0609 Other forms of dyspnea: Secondary | ICD-10-CM | POA: Insufficient documentation

## 2017-01-26 MED ORDER — PANTOPRAZOLE SODIUM 40 MG PO TBEC: 40.0000 mg | DELAYED_RELEASE_TABLET | Freq: Every day | ORAL | 2 refills | Status: DC

## 2017-01-26 MED ORDER — MOMETASONE FURO-FORMOTEROL FUM 100-5 MCG/ACT IN AERO: 2.0000 | INHALATION_SPRAY | Freq: Two times a day (BID) | RESPIRATORY_TRACT | 0 refills | Status: DC

## 2017-01-26 MED ORDER — ALBUTEROL SULFATE HFA 108 (90 BASE) MCG/ACT IN AERS: INHALATION_SPRAY | RESPIRATORY_TRACT | 1 refills | Status: DC

## 2017-01-26 MED ORDER — FAMOTIDINE 20 MG PO TABS: ORAL_TABLET | ORAL | 11 refills | Status: DC

## 2017-01-26 NOTE — Progress Notes (Signed)
Subjective:    Patient ID: Jessica Caldwell, female    DOB: 1996/04/12,   MRN: [id number]  21 yowf never smoker with asthma since age 21     04/10/2016 Acute OV  : Asthma Pt presents for an acute office visit. Patient complains of persistent asthma symptoms with cough, wheezing and shortness of breath. She says over the last several months during her pregnancy. She had more asthmatic symptoms. She was taken Pulmicort without much improvement. She had to use albuterol several times. She complains of 3 days ago. She had asthma exacerbation to the point where she felt like she lost her breath is scared her boyfriend and he started to do CPR. Patient said her breath came back to her and was able to improve with a nebulizer treatment with albuterol.  She is post partum , had baby girl  in August. Did well during delivery . She denies any fever, chest pain, orthopnea, PND, leg swelling, abdominal pain, nausea, vomiting, calf pain, swelling or hemoptysis. She is not is breast feeding.  Seen by PCP this week, dx with OM , started on Amoxicillin.  Spirometry today shows normal -poor effort. FEV1 88%, ratio 77, FVC 100%.  FeNO today is 5 .  Walk test in office with no drop in o2 sat on RA , O2 sats 98% walking rec Begin Symbicort 80 2 puffs Twice daily  .  Stop Pulmicort .  Use Albuterol Inhaler every 4hr as needed , this is your emergency /rescue inhaler .  Chest xray and labs today .  Follow up Dr. Chase Caldwell in 4 weeks and As needed       01/26/2017 acute extended ov/Jessica Caldwell re: dtc asthma on singulair/symb 80 ? Adherence?  Chief Complaint  Patient presents with  . Acute Visit    Pt here today concerned that one week ago she had an asthma attack so bad that it caused her to pass out, she states she does still feel like she has some trouble with her breathing,slight cough at times but non productive, Denies wheezing,chest tightness,fever, Mom is concerned that she was overly exposed to different  aninmals and possibly the heat is effecting her breathing   previous allergy testing in [place] age 13 "somewhat to cats"  Last good control x 3 years prior to [place]    Has not taken the symbicort yet on day of ov   No resp symptoms now that sleeping at mom's house where a/c but also cat present  Doe = MMRC1 = can walk nl pace, flat grade, can't hurry or go uphills or steps s sob Saw Jessica Caldwell one day prior to OV  > labs ok including tsh with EOS 0.1  Cp since age 68 migrates around assoc with heart pounding fleeting with nl holter 10/19/16 and nl echo 05/08/16   No obvious day to day or daytime variability or assoc excess/ purulent sputum or mucus plugs or hemoptysis   or chest tightness, subjective wheeze or overt sinus or hb symptoms. No unusual exp hx or h/o childhood pna/ asthma or knowledge of premature birth.  Sleeping ok now without nocturnal  or early am exacerbation  of respiratory  c/o's or need for noct saba. Also denies any obvious fluctuation of symptoms with weather or environmental changes or other aggravating or alleviating factors except as outlined above   Current Medications, Allergies, Complete Past Medical History, Past Surgical History, Family History, and Social History were reviewed in [employer] record.  ROS  The following are not active complaints unless bolded sore throat, dysphagia, dental problems, itching, sneezing,  nasal congestion or excess/ purulent secretions, ear ache,   fever, chills, sweats, unintended wt loss, classically pleuritic or exertional cp,  orthopnea pnd or leg swelling, presyncope, palpitations, abdominal pain, anorexia, nausea, vomiting, diarrhea  or change in bowel or bladder habits, change in stools or urine, dysuria,hematuria,  rash, arthralgias, visual complaints, headache, numbness, weakness or ataxia or problems with walking or coordination,  change in mood/affect or memory.            Objective:   Physical Exam  amb  somber obese wf nad - lets her mom do most of the talking   Wt Readings from Last 3 Encounters:  01/26/17 255 lb 3.2 oz (115.8 kg)  01/20/17 256 lb (116.1 kg)  08/20/16 250 lb (113.4 kg)    Vital signs reviewed  - - Note on arrival 02 sats  99 % on RA     HEENT: nl dentition, turbinates bilaterally, and oropharynx. Nl external ear canals without cough reflex   NECK :  without JVD/Nodes/TM/ nl carotid upstrokes bilaterally   LUNGS: no acc muscle use,  Nl contour chest which is clear to A and P bilaterally without cough on insp or exp maneuvers   CV:  RRR  no s3 or murmur or increase in P2, and no edema   ABD:  Obese/ soft and nontender with nl inspiratory excursion in the supine position. No bruits or organomegaly appreciated, bowel sounds nl  MS:  Nl gait/ ext warm without deformities, calf tenderness, cyanosis or clubbing No obvious joint restrictions   SKIN: warm and dry without lesions    NEURO:  alert, approp, nl sensorium with  no motor or cerebellar deficits apparent.     Labs ordered 01/26/2017  Allergy profile      Assessment & Plan:

## 2017-01-26 NOTE — Patient Instructions (Addendum)
Please remember to go to the lab department downstairs in the basement  for your tests - we will call you with the results when they are available.  Pantoprazole (protonix) 40 mg   Take  30-60 min before first meal of the day and Pepcid (famotidine)  20 mg one @  bedtime until return to office - this is the best way to tell whether stomach acid is contributing to your problem.   GERD (REFLUX)  is an extremely common cause of respiratory symptoms just like yours , many times with no obvious heartburn at all.    It can be treated with medication, but also with lifestyle changes including elevation of the head of your bed (ideally with 6 inch  bed blocks),  Smoking cessation, avoidance of late meals, excessive alcohol, and avoid fatty foods, chocolate, peppermint, colas, red wine, and acidic juices such as orange juice.  NO MINT OR MENTHOL PRODUCTS SO NO COUGH DROPS   USE SUGARLESS CANDY INSTEAD (Jolley ranchers or Stover's or Life Savers) or even ice chips will also do - the key is to swallow to prevent all throat clearing. NO OIL BASED VITAMINS - use powdered substitutes.    Plan A = Automatic = singulair one daily and dulera 100 (symb 80 ) Take 2 puffs first thing in am and then another 2 puffs about 12 hours later.    Work on inhaler technique:  relax and gently blow all the way out then take a nice smooth deep breath back in, triggering the inhaler at same time you start breathing in.  Hold for up to 5 seconds if you can. Blow out thru nose. Rinse and gargle with water when done   Plan B = Backup Only use your albuterol as a rescue medication to be used if you can't catch your breath by resting or doing a relaxed purse lip breathing pattern.  - The less you use it, the better it will work when you need it. - Ok to use the inhaler up to 2 puffs  every 4 hours if you must but call for appointment if use goes up over your usual need - Don't leave home without it !!  (think of it like the spare  tire for your car)   Plan C = Crisis - only use your albuterol nebulizer if you first try Plan B and it fails to help > ok to use the nebulizer up to every 4 hours but if start needing it regularly call for immediate appointment   Please schedule a follow up office visit in 4 weeks, sooner if needed  with all medications /inhalers/ solutions in hand so we can verify exactly what you are taking. This includes all medications from all doctors and over the counters  - see Tammy / Ramaswamy / Me

## 2017-01-27 LAB — RESPIRATORY ALLERGY PROFILE REGION II ~~LOC~~
Allergen, A. alternata, m6: <0.1 kU/L
Allergen, C. Herbarum, M2: <0.1 kU/L
Allergen, Comm Silver Birch, t9: <0.1 kU/L
Allergen, Mulberry, t76: <0.1 kU/L
Allergen, P. notatum, m1: <0.1 kU/L
Box Elder IgE: <0.1 kU/L
Cockroach: <0.1 kU/L
Common Ragweed: <0.1 kU/L
Dog Dander: <0.1 kU/L
Elm IgE: <0.1 kU/L
IgE (Immunoglobulin E), Serum: 30 kU/L (ref <115)
Johnson Grass: <0.1 kU/L
Pecan/Hickory Tree IgE: <0.1 kU/L
Rough Pigweed  IgE: <0.1 kU/L
Sheep Sorrel IgE: <0.1 kU/L

## 2017-01-27 NOTE — Assessment & Plan Note (Signed)
Really not able to reproduce the symptom in the office but suggestive more related to obesity and deconditioning them poorly controlled asthma.

## 2017-01-27 NOTE — Assessment & Plan Note (Signed)
Body mass index is 43.8 kg/m.  -    Lab Results  Component Value Date   TSH 4.13  12/23/16     Contributing to gerd risk/ doe/reviewed the need and the process to achieve and maintain neg calorie balance > defer f/u primary care including intermittently monitoring thyroid status

## 2017-01-27 NOTE — Assessment & Plan Note (Addendum)
Spirometry 01/26/2017  FEV1 3.24 (98%)  Ratio 81 with f/v only abn in effort dep section  - 01/26/2017  After extensive coaching HFA effectiveness =    75%  - 01/26/2017  Walked RA x 3 laps @ 185 ft each stopped due to  End of study, nl pace, no sob or desat  Symptoms are markedly disproportionate to objective findings and not clear this is actually much of a  lung problem but pt does appear to have difficult to sort out respiratory symptoms of unknown origin for which  DDX  = almost all start with A and  include Adherence, Ace Inhibitors, Acid Reflux, Active Sinus Disease, Alpha 1 Antitripsin deficiency, Anxiety masquerading as Airways dz,  ABPA,  Allergy(esp in young), Aspiration (esp in elderly), Adverse effects of meds,  Active smokers, A bunch of PE's (a small clot burden can't cause this syndrome unless there is already severe underlying pulm or vascular dz with poor reserve) plus two Bs  = Bronchiectasis and Beta blocker use..and one C= CHF     Adherence is always the initial "prime suspect" and is a multilayered concern that requires a "trust but verify" approach in every patient - starting with knowing how to use medications, especially inhalers, correctly, keeping up with refills and understanding the fundamental difference between maintenance and prns vs those medications only taken for a very short course and then stopped and not refilled.  - see teaching hfa - note had not taken maint rx am of ov so we need to use a trust but verify approach here and she should return with all meds in hand using a trust but verify approach to confirm accurate Medication  Reconciliation The principal here is that until we are certain that the  patients are doing what we've asked, it makes no sense to ask them to do more.    ? Acid (or non-acid) GERD > always difficult to exclude as up to 75% of pts in some series report no assoc GI/ Heartburn symptoms> rec max (24h)  acid suppression and diet restrictions/  reviewed and instructions given in writing.   ? Anxiety/depression > usually at the bottom of this list of usual suspects but should be much higher on this pt's based on H and P    ? Allergy > continue singulair, avoid cats/ repeat allergy profile   ? ABPA > check IgE but this seems unlikely with such a low eosinophil level .   ? A bunch of PEs this diagnosis was excluded previously with a workup with similar presentation and is extremely unlikely based on her exercise performance today.   I had an extended discussion with the patient reviewing all relevant studies completed to date and  lasting 25 minutes of a 40  minute acute office visit with pt new to me    re  severe non-specific but potentially very serious refractory respiratory symptoms of uncertain and potentially multiple  etiologies.   Each maintenance medication was reviewed in detail including most importantly the difference between maintenance and prns and under what circumstances the prns are to be triggered using an action plan format that is not reflected in the computer generated alphabetically organized AVS.    Formulary restrictions will be an ongoing challenge for the forseable future and I would be happy to pick an alternative if the pt will first  provide me a list of them but pt  will need to return here for training for any new device that  is required eg dpi vs hfa vs respimat.    In meantime we can always provide samples so the patient never runs out of any needed respiratory medications.  For today I gave her samples of Dulera 100 as we do not have the Symbicort 80 but explained they were basically the same thing and should be used the same way.  Please see AVS for specific instructions unique to this office visit that I personally wrote and verbalized to the the pt in detail and then reviewed with pt  by my nurse highlighting any changes in therapy/plan of care  recommended at today's visit.

## 2017-02-23 ENCOUNTER — Ambulatory Visit: Payer: Medicaid Other | Admitting: Internal Medicine

## 2017-07-23 ENCOUNTER — Telehealth: Payer: Self-pay | Admitting: Internal Medicine

## 2017-07-23 DIAGNOSIS — J4541 Moderate persistent asthma with (acute) exacerbation: Secondary | ICD-10-CM

## 2017-07-23 NOTE — Telephone Encounter (Signed)
Spoke with pt's mom and advised her that we would send in nebulizer to [facility]. Nothing further is needed.

## 2017-11-17 ENCOUNTER — Ambulatory Visit: Payer: Self-pay | Admitting: Internal Medicine

## 2017-11-19 ENCOUNTER — Ambulatory Visit: Payer: Self-pay | Admitting: Internal Medicine

## 2017-11-22 ENCOUNTER — Ambulatory Visit: Payer: Self-pay | Admitting: Internal Medicine

## 2018-03-03 ENCOUNTER — Encounter: Payer: Self-pay | Admitting: Nurse Practitioner

## 2018-03-03 ENCOUNTER — Ambulatory Visit (INDEPENDENT_AMBULATORY_CARE_PROVIDER_SITE_OTHER)
Admission: RE | Admit: 2018-03-03 | Discharge: 2018-03-03 | Disposition: A | Discharge status: Home / Self Care | Payer: Self-pay | Source: Ambulatory Visit | Attending: Nurse Practitioner | Admitting: Nurse Practitioner

## 2018-03-03 ENCOUNTER — Ambulatory Visit (INDEPENDENT_AMBULATORY_CARE_PROVIDER_SITE_OTHER): Payer: Self-pay | Admitting: Nurse Practitioner

## 2018-03-03 VITALS — BP 118/68 | HR 92 | Ht 64.0 in | Wt 251.2 lb

## 2018-03-03 DIAGNOSIS — R0781 Pleurodynia: Secondary | ICD-10-CM

## 2018-03-03 DIAGNOSIS — J4530 Mild persistent asthma, uncomplicated: Secondary | ICD-10-CM

## 2018-03-03 MED ORDER — MOMETASONE FURO-FORMOTEROL FUM 100-5 MCG/ACT IN AERO: 2.0000 | INHALATION_SPRAY | Freq: Two times a day (BID) | RESPIRATORY_TRACT | 0 refills | Status: DC

## 2018-03-03 NOTE — Patient Instructions (Addendum)
Will order chest xray and call with results Continue proventil as needed Continue symbicort daily Continue dulera - sample given in office today Follow up with Dr. Melvyn Novas in 1 year Please call the office if symptoms worsen

## 2018-03-03 NOTE — Assessment & Plan Note (Signed)
Patient Instructions  Will order chest xray and call with results Continue proventil as needed Continue symbicort daily Continue dulera - sample given in office today Follow up with Dr. Melvyn Novas in 1 year Please call the office if symptoms worsen

## 2018-03-03 NOTE — Progress Notes (Addendum)
@Patient  ID: Jessica Caldwell, female    DOB: 03-03-1996, 22 y.o.   MRN: [id number]  Chief Complaint  Patient presents with  . Pleurisy    rib pain on both sides    Referring provider: Bartholome Bill, MD  22 year old never smoker with asthma since age 3. Followed by Dr. Melvyn Novas.   HPI  Patient presents today with bilateral side pain at ribs. States she has a history of pleurisy and this pain is similar. Denies any fever or cough. Denies any shortness of breath. Has been compliant with medications except she has not been taking dulera.   Recent [facility] Encounters:  Plan from [place] 01-26-17 Please remember to go to the lab department downstairs in the basement  for your tests - we will call you with the results when they are available.  Pantoprazole (protonix) 40 mg   Take  30-60 min before first meal of the day and Pepcid (famotidine)  20 mg one @  bedtime until return to office - this is the best way to tell whether stomach acid is contributing to your problem.   Plan A = Automatic = singulair one daily and dulera 100 (symb 80 ) Take 2 puffs first thing in am and then another 2 puffs about 12 hours later.  Plan B = Backup Only use your albuterol as a rescue medication to be used if you can't catch your breath by resting or doing a relaxed purse lip breathing pattern.  - The less you use it, the better it will work when you need it. - Ok to use the inhaler up to 2 puffs  every 4 hours if you must but call for appointment if use goes up over your usual need - Don't leave home without it !!  (think of it like the spare tire for your car)   Plan C = Crisis - only use your albuterol nebulizer if you first try Plan B and it fails to help > ok to use the nebulizer up to every 4 hours but if start needing it regularly call for immediate appointment  Tests:  Spirometry 01/26/2017  FEV1 3.24 (98%)  Ratio 81 with f/v only abn in effort dep section  - 01/26/2017  After extensive  coaching HFA effectiveness =    75%  - 01/26/2017  Walked RA x 3 laps @ 185 ft each stopped due to  End of study, nl pace, no sob or desat    Allergies  Allergen Reactions  . Azithromycin Hives, Shortness Of Breath and Nausea And Vomiting  . Augmentin [Amoxicillin-Pot Clavulanate] Nausea And Vomiting and Hives  . Oxycodone-Acetaminophen Nausea And Vomiting  . Penicillins Rash    Vomits Has patient had a PCN reaction causing immediate rash, facial/tongue/throat swelling, SOB or lightheadedness with hypotension: No Has patient had a PCN reaction causing severe rash involving mucus membranes or skin necrosis: No Has patient had a PCN reaction that required hospitalization No Has patient had a PCN reaction occurring within the last 10 years: Yes If all of the above answers are "NO", then may proceed with Cephalosporin use.   . Codeine Hives and Nausea And Vomiting  . Sulfa Antibiotics Hives and Nausea And Vomiting    Immunization History  Administered Date(s) Administered  . Influenza,inj,Quad PF,6+ Mos 03/01/2015, 04/21/2016  . Meningococcal Conjugate 04/17/2014  . Pneumococcal Polysaccharide-23 07/20/2010  . Tdap 04/17/2014    Past Medical History:  Diagnosis Date  . Anemia   .  Asthma   . Hypothyroid   . IBS (irritable bowel syndrome)     Tobacco History: Social History   Tobacco Use  Smoking Status Never Smoker  Smokeless Tobacco Never Used   Counseling given: Never smoker   Outpatient Encounter Medications as of 03/03/2018  Medication Sig  . albuterol (PROAIR HFA) 108 (90 Base) MCG/ACT inhaler INHALE TWO PUFFS BY MOUTH EVERY 4 HOURS AS NEEDED FOR WHEEZING OR FOR SHORTNESS OF BREATH  . albuterol (PROVENTIL) (2.5 MG/3ML) 0.083% nebulizer solution Take 3-6 mLs (2.5-5 mg total) by nebulization every 4 (four) hours as needed for wheezing or shortness of breath.  . budesonide-formoterol (SYMBICORT) 80-4.5 MCG/ACT inhaler Inhale 2 puffs into the lungs 2 (two) times daily.    . ferrous sulfate 325 (65 FE) MG tablet Take 1 tablet (325 mg total) by mouth 2 (two) times daily with a meal.  . levothyroxine (SYNTHROID, LEVOTHROID) 100 MCG tablet Take 100 mcg by mouth daily before breakfast.  . loratadine (CLARITIN) 10 MG tablet Take 10 mg by mouth daily.  . mometasone-formoterol (DULERA) 100-5 MCG/ACT AERO Inhale 2 puffs into the lungs 2 (two) times daily.  . montelukast (SINGULAIR) 10 MG tablet Take 1 tablet (10 mg total) by mouth at bedtime.  . mometasone-formoterol (DULERA) 100-5 MCG/ACT AERO Inhale 2 puffs into the lungs 2 (two) times daily for 15 days.  . predniSONE (DELTASONE) 10 MG tablet Take 3 tablets x2 days PO, then 2 tablets x2 days, then 1 tablet x2 days  . [DISCONTINUED] benzonatate (TESSALON) 100 MG capsule Take 1 capsule (100 mg total) by mouth every 8 (eight) hours. (Patient not taking: Reported on 03/03/2018)  . [DISCONTINUED] famotidine (PEPCID) 20 MG tablet One at bedtime (Patient not taking: Reported on 03/03/2018)  . [DISCONTINUED] levothyroxine (SYNTHROID, LEVOTHROID) 50 MCG tablet TAKE ONE TABLET BY MOUTH ONCE DAILY (Patient not taking: Reported on 03/03/2018)  . [DISCONTINUED] mometasone-formoterol (DULERA) 100-5 MCG/ACT AERO Inhale 2 puffs into the lungs 2 (two) times daily.  . [DISCONTINUED] pantoprazole (PROTONIX) 40 MG tablet Take 1 tablet (40 mg total) by mouth daily. Take 30-60 min before first meal of the day (Patient not taking: Reported on 03/03/2018)   No facility-administered encounter medications on file as of 03/03/2018.      Review of Systems  Review of Systems  Constitutional: Negative.   HENT: Negative.   Respiratory: Negative for cough and shortness of breath.   Cardiovascular: Negative.   Gastrointestinal: Negative.   Musculoskeletal:       Bilateral rib pain  Allergic/Immunologic: Negative.   Neurological: Negative.   Psychiatric/Behavioral: Negative.        Physical Exam  BP 118/68 (BP Location: Left Arm, Patient  Position: Sitting, Cuff Size: Normal)   Pulse 92   Ht 5\' 4"  (1.626 m)   Wt 251 lb 3.2 oz (113.9 kg)   LMP 02/17/2018   SpO2 99%   BMI 43.12 kg/m   Wt Readings from Last 5 Encounters:  03/03/18 251 lb 3.2 oz (113.9 kg)  01/26/17 255 lb 3.2 oz (115.8 kg)  01/20/17 256 lb (116.1 kg)  08/20/16 250 lb (113.4 kg)  04/10/16 223 lb 9.6 oz (101.4 kg) (99 %, Z= 2.25)*   * Growth percentiles are based on CDC (Girls, 2-20 Years) data.     Physical Exam  Constitutional: She is oriented to person, place, and time. She appears well-developed and well-nourished. No distress.  Cardiovascular: Normal rate and regular rhythm.  Pulmonary/Chest: Effort normal and breath sounds normal.  Patient states that she has pain at areas noted. No tenderness noted upon palpation. No bruising or rash noted.   Neurological: She is alert and oriented to person, place, and time.  Psychiatric: She has a normal mood and affect.  Nursing note and vitals reviewed.    Lab Results:  CBC    Component Value Date/Time   WBC 5.6 04/10/2016 1507   RBC 4.20 04/10/2016 1507   HGB 12.1 04/10/2016 1507   HCT 35.8 (L) 04/10/2016 1507   PLT 230.0 04/10/2016 1507   MCV 85.2 04/10/2016 1507   MCH 26.7 (L) 12/12/2015 1145   MCHC 33.8 04/10/2016 1507   RDW 15.4 04/10/2016 1507   LYMPHSABS 1.1 09/02/2015 1116   MONOABS 0.4 09/02/2015 1116   EOSABS 0.0 09/02/2015 1116   BASOSABS 0.0 09/02/2015 1116    BMET    Component Value Date/Time   NA 141 04/10/2016 1507   K 4.3 04/10/2016 1507   CL 107 04/10/2016 1507   CO2 28 04/10/2016 1507   GLUCOSE 73 04/10/2016 1507   BUN 12 04/10/2016 1507   CREATININE 0.75 04/10/2016 1507   CALCIUM 8.8 04/10/2016 1507   GFRNONAA >60 02/10/2015 2135   GFRAA >60 02/10/2015 2135    BNP No results found for: BNP  ProBNP No results found for: PROBNP  Imaging: Dg Chest 2 View  Result Date: 03/03/2018 CLINICAL DATA:  Five day history of LEFT LOWER LATERAL chest/rib pain  and LEFT flank pain. Current history of asthma. EXAM: CHEST - 2 VIEW COMPARISON:  08/20/2016, 06/25/2015 and earlier. FINDINGS: Suboptimal inspiration accounts for crowded bronchovascular markings, especially in the bases, and accentuates the cardiac silhouette. Taking this into account, cardiomediastinal silhouette unremarkable and unchanged. Lungs clear. Bronchovascular markings normal. Pulmonary vascularity normal. No visible pleural effusions. No pneumothorax. Visualized bony thorax intact. IMPRESSION: Suboptimal inspiration.  No acute cardiopulmonary disease. Electronically Signed   By: Evangeline Dakin M.D.   On: 03/03/2018 17:08     Assessment & Plan:   Rib pain - Plan: DG Chest 2 View  Mild persistent asthma without complication   Mild persistent asthma Patient Instructions  Will order chest xray and call with results Continue proventil as needed Continue symbicort daily Continue dulera - sample given in office today Follow up with Dr. Melvyn Novas in 1 year Please call the office if symptoms worsen       Fenton Foy, NP 03/07/2018

## 2018-03-04 ENCOUNTER — Telehealth: Payer: Self-pay | Admitting: General Surgery

## 2018-03-04 MED ORDER — PREDNISONE 10 MG PO TABS: ORAL_TABLET | ORAL | 0 refills | Status: DC

## 2018-03-04 NOTE — Addendum Note (Signed)
Addended by: Fenton Foy on: 03/04/2018 09:01 AM   Modules accepted: Orders

## 2018-03-04 NOTE — Progress Notes (Signed)
Chart and office note reviewed in detail along with available xrays/ labs > agree with a/p as outlined   Pain is very similar to prev episode with neg cta 03/2016 so much less likely to represent occult PE.

## 2018-03-09 NOTE — Telephone Encounter (Signed)
Return call not received. Called pharmacy and confirmed the prescription was picked up on 02/17/18.

## 2018-03-23 ENCOUNTER — Ambulatory Visit (INDEPENDENT_AMBULATORY_CARE_PROVIDER_SITE_OTHER): Payer: Self-pay | Admitting: Nurse Practitioner

## 2018-03-23 ENCOUNTER — Encounter: Payer: Self-pay | Admitting: Nurse Practitioner

## 2018-03-23 DIAGNOSIS — J329 Chronic sinusitis, unspecified: Secondary | ICD-10-CM | POA: Insufficient documentation

## 2018-03-23 DIAGNOSIS — J4530 Mild persistent asthma, uncomplicated: Secondary | ICD-10-CM

## 2018-03-23 DIAGNOSIS — J0100 Acute maxillary sinusitis, unspecified: Secondary | ICD-10-CM

## 2018-03-23 MED ORDER — DOXYCYCLINE HYCLATE 100 MG PO TABS: 100.0000 mg | ORAL_TABLET | Freq: Two times a day (BID) | ORAL | 0 refills | Status: DC

## 2018-03-23 NOTE — Assessment & Plan Note (Signed)
Patient Instructions  Will order doxycycline May take mucinex May take delsym for cough Continue current medications ROV already scheduled with Dr. Melvyn Novas in 1 year Please call if symptoms worsen

## 2018-03-23 NOTE — Progress Notes (Signed)
@Patient  ID: Jessica Caldwell, female    DOB: [birth year], 22 y.o.   MRN: [id number]  Chief Complaint  Patient presents with  . Cough    with sore throat, will get dizzy if getting up or moving too quickly for a 22 over week    Referring provider: Bartholome Bill, MD  HPI  22 year old never smoker with asthma since age 22. Followed by Dr. Melvyn Novas.  Tests:  Chest x ray 03/03/18 - suboptimal inspiration. No acute cardiopulmonary disease Spirometry 01/26/2017 FEV1 3.24 (98%) Ratio 81 with f/v only abn in effort dep section  - 01/26/2017 After extensive coaching HFA effectiveness = 75%  -7/10/2018Walked RA x 3 laps @ 185 ft each stopped due to End of study, nl pace, no sob or desat  OV 03/23/18 - Acute - sore throat, dizziness, left ear pain Patient presents for sore throat, left ear pain and dizziness. States that symptoms started 2 weeks ago. States that symptoms have progressively worsened. She is compliant with albuterol, Symbicort, dulera, and Singulair. Denies any fever or nausea.     Allergies  Allergen Reactions  . Azithromycin Hives, Shortness Of Breath and Nausea And Vomiting  . Augmentin [Amoxicillin-Pot Clavulanate] Nausea And Vomiting and Hives  . Oxycodone-Acetaminophen Nausea And Vomiting  . Penicillins Rash    Vomits Has patient had a PCN reaction causing immediate rash, facial/tongue/throat swelling, SOB or lightheadedness with hypotension: No Has patient had a PCN reaction causing severe rash involving mucus membranes or skin necrosis: No Has patient had a PCN reaction that required hospitalization No Has patient had a PCN reaction occurring within the last 10 years: Yes If all of the above answers are "NO", then may proceed with Cephalosporin use.   . Codeine Hives and Nausea And Vomiting  . Sulfa Antibiotics Hives and Nausea And Vomiting    Immunization History  Administered Date(s) Administered  . Influenza,inj,Quad PF,6+ Mos 03/01/2015, 04/21/2016  .  Meningococcal Conjugate 04/17/2014  . Pneumococcal Polysaccharide-23 07/20/2010  . Tdap 04/17/2014    Past Medical History:  Diagnosis Date  . Anemia   . Asthma   . Hypothyroid   . IBS (irritable bowel syndrome)     Tobacco History: Social History   Tobacco Use  Smoking Status Never Smoker  Smokeless Tobacco Never Used   Counseling given: Yes   Outpatient Encounter Medications as of 03/23/2018  Medication Sig  . albuterol (PROAIR HFA) 108 (90 Base) MCG/ACT inhaler INHALE TWO PUFFS BY MOUTH EVERY 4 HOURS AS NEEDED FOR WHEEZING OR FOR SHORTNESS OF BREATH  . albuterol (PROVENTIL) (2.5 MG/3ML) 0.083% nebulizer solution Take 3-6 mLs (2.5-5 mg total) by nebulization every 4 (four) hours as needed for wheezing or shortness of breath.  . budesonide-formoterol (SYMBICORT) 80-4.5 MCG/ACT inhaler Inhale 2 puffs into the lungs 2 (two) times daily.  . ferrous sulfate 325 (65 FE) MG tablet Take 1 tablet (325 mg total) by mouth 2 (two) times daily with a meal.  . levothyroxine (SYNTHROID, LEVOTHROID) 100 MCG tablet Take 100 mcg by mouth daily before breakfast.  . loratadine (CLARITIN) 10 MG tablet Take 10 mg by mouth daily.  . mometasone-formoterol (DULERA) 100-5 MCG/ACT AERO Inhale 2 puffs into the lungs 2 (two) times daily.  . montelukast (SINGULAIR) 10 MG tablet Take 1 tablet (10 mg total) by mouth at bedtime.  . predniSONE (DELTASONE) 10 MG tablet Take 3 tablets x2 days PO, then 2 tablets x2 days, then 1 tablet x2 days  . doxycycline (VIBRA-TABS) 100 MG  tablet Take 1 tablet (100 mg total) by mouth 2 (two) times daily.  . mometasone-formoterol (DULERA) 100-5 MCG/ACT AERO Inhale 2 puffs into the lungs 2 (two) times daily for 15 days.   No facility-administered encounter medications on file as of 03/23/2018.      Review of Systems  Review of Systems  Constitutional: Negative.  Negative for chills and fever.  HENT: Positive for congestion, ear pain, postnasal drip, sinus pressure, sinus  pain, sneezing and sore throat.   Respiratory: Negative for cough, shortness of breath and wheezing.   Cardiovascular: Negative.   Gastrointestinal: Negative.   Allergic/Immunologic: Negative.   Neurological: Negative.   Psychiatric/Behavioral: Negative.        Physical Exam  BP 112/66 (BP Location: Right Arm, Patient Position: Sitting, Cuff Size: Large)   Pulse 97   Ht 5\' 4"  (1.626 m)   Wt 250 lb 12.8 oz (113.8 kg)   SpO2 99%   BMI 43.05 kg/m   Wt Readings from Last 5 Encounters:  03/23/18 250 lb 12.8 oz (113.8 kg)  03/03/18 251 lb 3.2 oz (113.9 kg)  01/26/17 255 lb 3.2 oz (115.8 kg)  01/20/17 256 lb (116.1 kg)  08/20/16 250 lb (113.4 kg)     Physical Exam  Constitutional: She is oriented to person, place, and time. She appears well-developed and well-nourished. No distress.  HENT:  Right Ear: No drainage. Tympanic membrane is not bulging.  Left Ear: No drainage. Tympanic membrane is bulging.  Nose: Right sinus exhibits maxillary sinus tenderness and frontal sinus tenderness. Left sinus exhibits maxillary sinus tenderness and frontal sinus tenderness.  Mouth/Throat: Posterior oropharyngeal erythema present. No oropharyngeal exudate.  Cardiovascular: Normal rate and regular rhythm.  Pulmonary/Chest: Effort normal and breath sounds normal.  Neurological: She is alert and oriented to person, place, and time.  Psychiatric: She has a normal mood and affect.  Nursing note and vitals reviewed.     Assessment & Plan:   Sinusitis Patient Instructions  Will order doxycycline May take mucinex May take delsym for cough Continue current medications ROV already scheduled with Dr. Melvyn Novas in 1 year Please call if symptoms worsen    Mild persistent asthma Continue current medications as directed     Fenton Foy, NP 03/23/2018

## 2018-03-23 NOTE — Assessment & Plan Note (Signed)
Continue current medications as directed

## 2018-03-23 NOTE — Patient Instructions (Addendum)
Will order doxycycline May take mucinex May take delsym for cough Continue current medications ROV already scheduled with Dr. Melvyn Novas in 1 year Please call if symptoms worsen

## 2018-03-28 NOTE — Progress Notes (Signed)
Chart and office note reviewed in detail  > agree with a/p as outlined    

## 2018-04-04 ENCOUNTER — Telehealth: Payer: Self-pay | Admitting: Internal Medicine

## 2018-04-04 MED ORDER — AMOXICILLIN 875 MG PO TABS: 875.0000 mg | ORAL_TABLET | Freq: Two times a day (BID) | ORAL | 0 refills | Status: DC

## 2018-04-04 NOTE — Telephone Encounter (Signed)
Pt's mom is calling back [phone]

## 2018-04-04 NOTE — Telephone Encounter (Signed)
Called and left message on the phone to let  the patient know about the antibiotics. Nothing further needed at this time.

## 2018-04-04 NOTE — Telephone Encounter (Signed)
Called and spoke to pt's mother, Leana Roe (DPR). Leana Roe is requesting that amoxicillin be prescribed in stead of doxy.  Tracie states amoxicillin has worked better for pt previously.  Pt is still experiencing no prod cough & right ear pain. preferred pharmacy is [facility] on [address].  MW please advise. Thanks  03/23/18 AVS: Editor: Fenton Foy, NP (Nurse Practitioner)    Patient Instructions  Will order doxycycline May take mucinex May take delsym for cough Continue current medications ROV already scheduled with Dr. Melvyn Novas in 1 year Please call if symptoms worsen

## 2018-04-04 NOTE — Telephone Encounter (Signed)
Listed as allergy to pcn and amoxicillin  If she can give [place] the name of the pharmacy who filled the amox ok to refill it thru them, if not, then will need to try the doxy first or return for ov

## 2018-04-04 NOTE — Telephone Encounter (Signed)
Pharm is [facility] on [address] in [place].

## 2018-04-04 NOTE — Telephone Encounter (Signed)
Called and spoke to pt's mother, Leana Roe.  Leana Roe stated that pt has taken amoxicillin and penicillin previously without a reaction.  Leana Roe states that pt has intolerance to Augmentin only. I have spoken to [facility] with [place], who states that amoxicillin 875 was last refilled on 02/02/17. Per pt's chart, pt has been prescribed both 500mg  and 875mg  of amoxicillin previously.   MW please verify dosage.

## 2018-04-04 NOTE — Telephone Encounter (Signed)
lmtcb x1 for pt's mother, Jessica Caldwell

## 2018-04-04 NOTE — Telephone Encounter (Signed)
Please correct the allergy to just list augmentin >> diarrhea  Ok to give the 875 bid x 10 days

## 2019-03-06 ENCOUNTER — Ambulatory Visit: Payer: Self-pay | Admitting: Internal Medicine

## 2019-12-03 ENCOUNTER — Encounter (HOSPITAL_BASED_OUTPATIENT_CLINIC_OR_DEPARTMENT_OTHER): Payer: Self-pay | Admitting: Emergency Medicine

## 2019-12-03 ENCOUNTER — Other Ambulatory Visit: Payer: Self-pay

## 2019-12-03 ENCOUNTER — Emergency Department (HOSPITAL_BASED_OUTPATIENT_CLINIC_OR_DEPARTMENT_OTHER): Payer: Self-pay

## 2019-12-03 ENCOUNTER — Emergency Department (HOSPITAL_BASED_OUTPATIENT_CLINIC_OR_DEPARTMENT_OTHER)
Admission: EM | Admit: 2019-12-03 | Discharge: 2019-12-03 | Disposition: A | Discharge status: Home / Self Care | Payer: Self-pay | Attending: Emergency Medicine | Admitting: Emergency Medicine

## 2019-12-03 DIAGNOSIS — E039 Hypothyroidism, unspecified: Secondary | ICD-10-CM | POA: Insufficient documentation

## 2019-12-03 DIAGNOSIS — Z79899 Other long term (current) drug therapy: Secondary | ICD-10-CM | POA: Insufficient documentation

## 2019-12-03 DIAGNOSIS — J45901 Unspecified asthma with (acute) exacerbation: Secondary | ICD-10-CM | POA: Insufficient documentation

## 2019-12-03 DIAGNOSIS — Z20822 Contact with and (suspected) exposure to covid-19: Secondary | ICD-10-CM | POA: Insufficient documentation

## 2019-12-03 DIAGNOSIS — R059 Cough, unspecified: Secondary | ICD-10-CM

## 2019-12-03 LAB — SARS CORONAVIRUS 2 BY RT PCR (HOSPITAL ORDER, PERFORMED IN ~~LOC~~ HOSPITAL LAB): SARS Coronavirus 2: NEGATIVE

## 2019-12-03 LAB — PREGNANCY, URINE: Preg Test, Ur: NEGATIVE

## 2019-12-03 MED ORDER — ALBUTEROL SULFATE (2.5 MG/3ML) 0.083% IN NEBU: 2.5000 mg | INHALATION_SOLUTION | RESPIRATORY_TRACT | 1 refills | Status: AC | PRN

## 2019-12-03 MED ORDER — PREDNISONE 50 MG PO TABS
60.0000 mg | ORAL_TABLET | Freq: Once | ORAL | Status: AC
  Administered 2019-12-03: 60 mg via ORAL
  Filled 2019-12-03: qty 1

## 2019-12-03 MED ORDER — ALBUTEROL SULFATE HFA 108 (90 BASE) MCG/ACT IN AERS
1.0000 | INHALATION_SPRAY | Freq: Once | RESPIRATORY_TRACT | Status: AC
  Administered 2019-12-03: 2 via RESPIRATORY_TRACT
  Filled 2019-12-03: qty 6.7

## 2019-12-03 MED ORDER — PREDNISONE 20 MG PO TABS: 40.0000 mg | ORAL_TABLET | Freq: Every day | ORAL | 0 refills | Status: AC

## 2019-12-03 MED ORDER — ALBUTEROL SULFATE HFA 108 (90 BASE) MCG/ACT IN AERS: INHALATION_SPRAY | RESPIRATORY_TRACT | 1 refills | Status: DC

## 2019-12-03 MED ORDER — ALBUTEROL SULFATE (2.5 MG/3ML) 0.083% IN NEBU: 2.5000 mg | INHALATION_SOLUTION | RESPIRATORY_TRACT | 1 refills | Status: DC | PRN

## 2019-12-03 NOTE — ED Provider Notes (Signed)
[facility] EMERGENCY DEPARTMENT Provider Note   CSN: [id number] Arrival date & time: 12/03/19  1313     History Chief Complaint  Patient presents with  . Cough    Jessica Caldwell is a 24 y.o. female with PMH/o anemia, asthma, hypothyroid who presents for evaluation of cough x 5 days. She reports that her children at home had symptoms to began with. She reports that her children got better but her cough persisted. Cough is dry. She reports occasionally she'll get into a coughing fit and feel like she has trouble breathing. No other difficulty breathing. Denies any CP. She has a history of asthma and states that she has used her inhalers with no improvement. No fevers at home. She has not gotten vaccinated for COVID 19. No known exposure to COVID 19.   The history is provided by the patient.       Past Medical History:  Diagnosis Date  . Anemia   . Asthma   . Hypothyroid   . IBS (irritable bowel syndrome)     Patient Active Problem List   Diagnosis Date Noted  . Sinusitis 03/23/2018  . Dyspnea on exertion 01/26/2017  . Morbid (severe) obesity due to excess calories ([place]) 01/26/2017  . Anemia affecting pregnancy in third trimester 12/17/2015  . History of asthma 10/01/2015  . Supervision of normal first pregnancy 09/02/2015  . Obesity in pregnancy 09/02/2015  . Morbid obesity ([place]) 01/16/2015  . Chronic cough 06/10/2013  . Mild persistent asthma 05/26/2013  . Hemoptysis 05/26/2013  . Easy bruising 05/26/2013  . Pleurisy 05/26/2013  . Acquired hypothyroidism 06/01/2011    Past Surgical History:  Procedure Laterality Date  . KNEE SURGERY    . TONSILLECTOMY    . VIDEO BRONCHOSCOPY Bilateral 06/29/2013   Procedure: VIDEO BRONCHOSCOPY WITHOUT FLUORO;  Surgeon: Brand Males, MD;  Location: [facility];  Service: Cardiopulmonary;  Laterality: Bilateral;     OB History    Gravida  1   Para      Term      Preterm      AB      Living        SAB        TAB      Ectopic      Multiple      Live Births              Family History  Problem Relation Age of Onset  . Asthma Paternal Grandfather   . Heart disease Paternal Grandfather   . Cancer Paternal Grandfather        and several uncles.   . Asthma Paternal Grandmother   . Clotting disorder Paternal Grandmother   . Rheum arthritis Paternal Grandmother   . Heart disease Maternal Grandfather   . Heart disease Paternal Uncle     Social History   Tobacco Use  . Smoking status: Never Smoker  . Smokeless tobacco: Never Used  Substance Use Topics  . Alcohol use: No  . Drug use: No    Home Medications Prior to Admission medications   Medication Sig Start Date End Date Taking? Authorizing Provider  albuterol (PROAIR HFA) 108 (90 Base) MCG/ACT inhaler INHALE TWO PUFFS BY MOUTH EVERY 4 HOURS AS NEEDED FOR WHEEZING OR FOR SHORTNESS OF BREATH 12/03/19   Providence Lanius A, PA-C  albuterol (PROVENTIL) (2.5 MG/3ML) 0.083% nebulizer solution Take 3-6 mLs (2.5-5 mg total) by nebulization every 4 (four) hours as needed for wheezing or shortness of  breath. 12/03/19   Volanda Napoleon, PA-C  amoxicillin (AMOXIL) 875 MG tablet Take 1 tablet (875 mg total) by mouth 2 (two) times daily. 04/04/18   Tanda Rockers, MD  budesonide-formoterol (SYMBICORT) 80-4.5 MCG/ACT inhaler Inhale 2 puffs into the lungs 2 (two) times daily. 04/10/16   Parrett, Fonnie Mu, NP  doxycycline (VIBRA-TABS) 100 MG tablet Take 1 tablet (100 mg total) by mouth 2 (two) times daily. 03/23/18   Fenton Foy, NP  ferrous sulfate 325 (65 FE) MG tablet Take 1 tablet (325 mg total) by mouth 2 (two) times daily with a meal. 12/18/15   Lavonia Drafts, MD  levothyroxine (SYNTHROID, LEVOTHROID) 100 MCG tablet Take 100 mcg by mouth daily before breakfast.    [provider]  loratadine (CLARITIN) 10 MG tablet Take 10 mg by mouth daily.    [provider]  mometasone-formoterol (DULERA) 100-5 MCG/ACT AERO  Inhale 2 puffs into the lungs 2 (two) times daily. 01/26/17   Tanda Rockers, MD  mometasone-formoterol (DULERA) 100-5 MCG/ACT AERO Inhale 2 puffs into the lungs 2 (two) times daily for 15 days. 03/03/18 03/18/18  Fenton Foy, NP  montelukast (SINGULAIR) 10 MG tablet Take 1 tablet (10 mg total) by mouth at bedtime. 05/08/15   Midge Minium, MD  predniSONE (DELTASONE) 20 MG tablet Take 2 tablets (40 mg total) by mouth daily for 4 days. 12/03/19 12/07/19  Volanda Napoleon, PA-C    Allergies    Augmentin [amoxicillin-pot clavulanate], Oxycodone-acetaminophen, Codeine, and Sulfa antibiotics  Review of Systems   Review of Systems  Constitutional: Negative for fever.  HENT: Positive for congestion.   Respiratory: Positive for cough. Negative for shortness of breath.   Cardiovascular: Negative for chest pain.  Gastrointestinal: Negative for abdominal pain, nausea and vomiting.  Genitourinary: Negative for dysuria and hematuria.  Neurological: Negative for headaches.  All other systems reviewed and are negative.   Physical Exam Updated Vital Signs BP 124/73 (BP Location: Right Arm)   Pulse 77   Temp 98.3 F (36.8 C) (Oral)   Resp 18   Ht 5\' 4"  (1.626 m)   Wt 108.9 kg   LMP 11/12/2019   SpO2 99%   BMI 41.20 kg/m   Physical Exam Vitals and nursing note reviewed.  Constitutional:      Appearance: She is well-developed.  HENT:     Head: Normocephalic and atraumatic.  Eyes:     General: No scleral icterus.       Right eye: No discharge.        Left eye: No discharge.     Conjunctiva/sclera: Conjunctivae normal.  Pulmonary:     Effort: Pulmonary effort is normal.     Breath sounds: Normal breath sounds.     Comments: Lungs clear to auscultation bilaterally.  Symmetric chest rise.  No wheezing, rales, rhonchi. No evidence of respiratory distress. Able to speak in full sentences without any difficulty.  Skin:    General: Skin is warm and dry.  Neurological:     Mental  Status: She is alert.  Psychiatric:        Speech: Speech normal.        Behavior: Behavior normal.     ED Results / Procedures / Treatments   Labs (all labs ordered are listed, but only abnormal results are displayed) Labs Reviewed  SARS CORONAVIRUS 2 BY RT PCR ([facility])  PREGNANCY, URINE    EKG None  Radiology DG  Chest 2 View  Result Date: 12/03/2019 CLINICAL DATA:  Cough and congestion x 5 days; asthma; no fever; nonsmoker EXAM: CHEST - 2 VIEW COMPARISON:  03/03/2018 FINDINGS: The heart size and mediastinal contours are within normal limits. Both lungs are clear. No pleural effusion or pneumothorax. The visualized skeletal structures are unremarkable. IMPRESSION: Normal chest radiographs. Electronically Signed   By: Lajean Manes M.D.   On: 12/03/2019 14:34    Procedures Procedures (including critical care time)  Medications Ordered in ED Medications  predniSONE (DELTASONE) tablet 60 mg (has no administration in time range)  albuterol (VENTOLIN HFA) 108 (90 Base) MCG/ACT inhaler 1-2 puff (has no administration in time range)    ED Course  I have reviewed the triage vital signs and the nursing notes.  Pertinent labs & imaging results that were available during my care of the patient were reviewed by me and considered in my medical decision making (see chart for details).    MDM Rules/Calculators/A&P                      24 year old female who presents for evaluation of cough x5 days.  Kids at home sick with similar symptoms.  No fevers.  History of asthma.  She has been using her inhaler without relief.  Initially arrival, she is afebrile, nontoxic-appearing.  Vital signs are stable.  No obvious wheezing noted on exam.  She did not get vaccinated for Covid.  Does not know any of any COVID-19 exposure.  Plan for chest x-Caldwell, Covid test.  Urine pregnancy negative.  Chest x-Caldwell negative for any infectious etiology.  Suspect  this is most likely asthma exacerbation.  We will plan to do a short course of prednisone.  Patient also out of nebulizer solution, albuterol.  We will plan to refill.  She has a Covid test pending.  Discussed with patient regarding at home supportive care measures. At this time, patient exhibits no emergent life-threatening condition that require further evaluation in ED or admission. Patient had ample opportunity for questions and discussion. All patient's questions were answered with full understanding. Strict return precautions discussed. Patient expresses understanding and agreement to plan.   Jessica Caldwell was evaluated in Emergency Department on 12/03/2019 for the symptoms described in the history of present illness. She was evaluated in the context of the global COVID-19 pandemic, which necessitated consideration that the patient might be at risk for infection with the SARS-CoV-2 virus that causes COVID-19. Institutional protocols and algorithms that pertain to the evaluation of patients at risk for COVID-19 are in a state of rapid change based on information released by regulatory bodies including the CDC and federal and state organizations. These policies and algorithms were followed during the patient's care in the ED.  Portions of this note were generated with [employer]. Dictation errors may occur despite best attempts at proofreading.   Final Clinical Impression(s) / ED Diagnoses Final diagnoses:  Cough  Exacerbation of asthma, unspecified asthma severity, unspecified whether persistent    Rx / DC Orders ED Discharge Orders         Ordered    albuterol (PROAIR HFA) 108 (90 Base) MCG/ACT inhaler     12/03/19 1545    albuterol (PROVENTIL) (2.5 MG/3ML) 0.083% nebulizer solution  Every 4 hours PRN,   Status:  Discontinued     12/03/19 1545    predniSONE (DELTASONE) 20 MG tablet  Daily     12/03/19 1545    albuterol (PROVENTIL) (  2.5 MG/3ML) 0.083% nebulizer solution   Every 4 hours PRN     12/03/19 1545           Volanda Napoleon, PA-C 12/03/19 1547    Veryl Speak, MD 12/04/19 1048

## 2019-12-03 NOTE — ED Notes (Signed)
Pt discharged to home with mother. NAD

## 2019-12-03 NOTE — Discharge Instructions (Addendum)
You have a COVID-19 test pending.  You should quarantine until test comes back.  I will take a few hours.  You can check online or on the MyChart app regarding results.  Take prednisone as directed.  Use albuterol solution, inhaler as directed.  Return the emergency department for any difficulty breathing, chest pain, fever or any other worsening or concerning symptoms.

## 2019-12-03 NOTE — ED Notes (Signed)
Pt ambulatory with steady gait to restroom for urine

## 2019-12-03 NOTE — ED Triage Notes (Signed)
Cough x 5 days. Hx of asthma. Using inhaler without relief, out of nebulizer meds

## 2020-07-31 ENCOUNTER — Telehealth: Payer: Self-pay | Admitting: Internal Medicine

## 2020-07-31 NOTE — Telephone Encounter (Signed)
Spoke with the pt's mother  She states pt having "barking, deep cough" for the past few wks  Asking for sample of "inhaler" Pt not seen since Sept 2019  I advised can not prescribe her anything without appt  She is scheduled for appt with APP 08/06/20  Advised to go to [facility] for eval in the meantime if feels can not wait for appt

## 2020-08-06 ENCOUNTER — Ambulatory Visit: Payer: Self-pay | Admitting: Primary Care

## 2020-08-06 NOTE — Progress Notes (Deleted)
@Patient  ID: Jessica Caldwell, female    DOB: April 04, 1996, 25 y.o.   MRN: [id number]  No chief complaint on file.   Referring provider: Bartholome Bill, MD  HPI: 25 year old female, never smoked. PMH significant for mild persistent asthma, chronic cough, pleurisy, hypothyroidism, obesity. Patient of Dr. Melvyn Novas, last seen by pulmonary NP on 03/23/18.   08/06/2020 Patient presents today for overdue follow-up asthma. She had normal spirometry in July 2018. Dyspnea felt to be related to obesity and deconditioning than uncontrolled asthma. Patient's mother called our office on 07/31/20 asking for sample of inhaler as patient was experiencing more shortness of breath and had a barking deep cough. Needs DPR on file. She was previously maintained on Dulera 100 and Singulair. And recommended Pantoprazole 40mg  daily and Famotidine 20mg  at bedtime  Allergies  Allergen Reactions  . Augmentin [Amoxicillin-Pot Clavulanate] Hives and Nausea And Vomiting  . Oxycodone-Acetaminophen Nausea And Vomiting  . Codeine Hives and Nausea And Vomiting  . Sulfa Antibiotics Hives and Nausea And Vomiting    Immunization History  Administered Date(s) Administered  . HPV Quadrivalent 04/17/2014  . Hepatitis A 04/17/2014  . Influenza,inj,Quad PF,6+ Mos 03/01/2015, 04/21/2016  . Influenza,inj,quad, With Preservative 08/23/2014, 03/01/2015, 04/21/2016  . Meningococcal Conjugate 04/17/2014  . Pneumococcal Polysaccharide-23 07/20/2010, 12/01/2018  . Tdap 04/17/2014  . Varicella 04/17/2014    Past Medical History:  Diagnosis Date  . Anemia   . Asthma   . Hypothyroid   . IBS (irritable bowel syndrome)     Tobacco History: Social History   Tobacco Use  Smoking Status Never Smoker  Smokeless Tobacco Never Used   Counseling given: Not Answered   Outpatient Medications Prior to Visit  Medication Sig Dispense Refill  . albuterol (PROAIR HFA) 108 (90 Base) MCG/ACT inhaler INHALE TWO PUFFS BY MOUTH EVERY 4  HOURS AS NEEDED FOR WHEEZING OR FOR SHORTNESS OF BREATH 8 g 1  . albuterol (PROVENTIL) (2.5 MG/3ML) 0.083% nebulizer solution Take 3-6 mLs (2.5-5 mg total) by nebulization every 4 (four) hours as needed for wheezing or shortness of breath. 3 mL 1  . amoxicillin (AMOXIL) 875 MG tablet Take 1 tablet (875 mg total) by mouth 2 (two) times daily. 20 tablet 0  . budesonide-formoterol (SYMBICORT) 80-4.5 MCG/ACT inhaler Inhale 2 puffs into the lungs 2 (two) times daily. 1 Inhaler 5  . doxycycline (VIBRA-TABS) 100 MG tablet Take 1 tablet (100 mg total) by mouth 2 (two) times daily. 14 tablet 0  . ferrous sulfate 325 (65 FE) MG tablet Take 1 tablet (325 mg total) by mouth 2 (two) times daily with a meal. 60 tablet 3  . levothyroxine (SYNTHROID, LEVOTHROID) 100 MCG tablet Take 100 mcg by mouth daily before breakfast.    . loratadine (CLARITIN) 10 MG tablet Take 10 mg by mouth daily.    . mometasone-formoterol (DULERA) 100-5 MCG/ACT AERO Inhale 2 puffs into the lungs 2 (two) times daily. 1 Inhaler 0  . mometasone-formoterol (DULERA) 100-5 MCG/ACT AERO Inhale 2 puffs into the lungs 2 (two) times daily for 15 days. 1 Inhaler 0  . montelukast (SINGULAIR) 10 MG tablet Take 1 tablet (10 mg total) by mouth at bedtime. 30 tablet 6   No facility-administered medications prior to visit.      Review of Systems  Review of Systems   Physical Exam  There were no vitals taken for this visit. Physical Exam   Lab Results:  CBC    Component Value Date/Time   WBC 5.6 04/10/2016  1507   RBC 4.20 04/10/2016 1507   HGB 12.1 04/10/2016 1507   HCT 35.8 (L) 04/10/2016 1507   PLT 230.0 04/10/2016 1507   MCV 85.2 04/10/2016 1507   MCH 26.7 (L) 12/12/2015 1145   MCHC 33.8 04/10/2016 1507   RDW 15.4 04/10/2016 1507   LYMPHSABS 1.1 09/02/2015 1116   MONOABS 0.4 09/02/2015 1116   EOSABS 0.0 09/02/2015 1116   BASOSABS 0.0 09/02/2015 1116    BMET    Component Value Date/Time   NA 141 04/10/2016 1507   K 4.3  04/10/2016 1507   CL 107 04/10/2016 1507   CO2 28 04/10/2016 1507   GLUCOSE 73 04/10/2016 1507   BUN 12 04/10/2016 1507   CREATININE 0.75 04/10/2016 1507   CALCIUM 8.8 04/10/2016 1507   GFRNONAA >60 02/10/2015 2135   GFRAA >60 02/10/2015 2135    BNP No results found for: BNP  ProBNP No results found for: PROBNP  Imaging: No results found.   Assessment & Plan:   No problem-specific Assessment & Plan notes found for this encounter.     Martyn Ehrich, NP 08/06/2020

## 2020-08-20 ENCOUNTER — Ambulatory Visit: Payer: Self-pay | Admitting: Primary Care

## 2020-08-22 ENCOUNTER — Ambulatory Visit (INDEPENDENT_AMBULATORY_CARE_PROVIDER_SITE_OTHER): Payer: Self-pay | Admitting: Adult Health

## 2020-08-22 ENCOUNTER — Other Ambulatory Visit: Payer: Self-pay

## 2020-08-22 ENCOUNTER — Encounter: Payer: Self-pay | Admitting: Adult Health

## 2020-08-22 DIAGNOSIS — K029 Dental caries, unspecified: Secondary | ICD-10-CM | POA: Insufficient documentation

## 2020-08-22 DIAGNOSIS — J4530 Mild persistent asthma, uncomplicated: Secondary | ICD-10-CM

## 2020-08-22 MED ORDER — BUDESONIDE-FORMOTEROL FUMARATE 80-4.5 MCG/ACT IN AERO: 2.0000 | INHALATION_SPRAY | Freq: Two times a day (BID) | RESPIRATORY_TRACT | 5 refills | Status: AC

## 2020-08-22 MED ORDER — ALBUTEROL SULFATE HFA 108 (90 BASE) MCG/ACT IN AERS: INHALATION_SPRAY | RESPIRATORY_TRACT | 3 refills | Status: DC

## 2020-08-22 NOTE — Patient Instructions (Signed)
Restart Symbicort 80 2 puffs Twice daily  .  Use Albuterol Inhaler every 4hr as needed , this is your emergency /rescue inhaler .  Dental care as discussed .  Follow up Dr. Chase Caller in 2- 3 months with PFT  and As needed   Please contact office for sooner follow up if symptoms do not improve or worsen or seek emergency care

## 2020-08-22 NOTE — Assessment & Plan Note (Signed)
Multiple dental caries noted on exam.  Encouraged on dental follow-up.  Patient says she is currently working on this.

## 2020-08-22 NOTE — Progress Notes (Signed)
@Patient  ID: Jessica Caldwell, female    DOB: 02-Nov-1995, 25 y.o.   MRN: [id number]  Chief Complaint  Patient presents with  . Follow-up    Referring provider: Bartholome Bill, MD  HPI: 25 year old female never smoker followed for asthma  TEST/EVENTS :  Spirometry 03/2016  shows normal -poor effort. FEV1 88%, ratio 77, FVC 100%.  FeNO - 5 .   Chest xray 11/2019 clear lungs.   08/22/2020 Follow up : Asthma  Patient presents for a follow-up visit for asthma.  Patient was last seen September 2019. Has noticed more coughing and wheezing which is worse at night. Needs refill of her inhalers, has not used in couple of years.  Denies any increased sinus drainage or postnasal drip.  No discolored mucus.  Does have significant dental issues is planning going to the dentist soon.  Says she has put on a lot of weight in the last couple years she has had 3 kids that are under 5. Denies current pregnancy.  Says she had a cold with a deep cough few weeks ago that has resolved but continues to have some intermittent wheezing and cough is worse at night.  She denies any heartburn or indigestion.   Allergies  Allergen Reactions  . Augmentin [Amoxicillin-Pot Clavulanate] Hives and Nausea And Vomiting  . Oxycodone-Acetaminophen Nausea And Vomiting  . Codeine Hives and Nausea And Vomiting  . Sulfa Antibiotics Hives and Nausea And Vomiting    Immunization History  Administered Date(s) Administered  . HPV Quadrivalent 04/17/2014  . Hepatitis A 04/17/2014  . Influenza,inj,Quad PF,6+ Mos 03/01/2015, 04/21/2016  . Influenza,inj,quad, With Preservative 08/23/2014, 03/01/2015, 04/21/2016  . Meningococcal Conjugate 04/17/2014  . Pneumococcal Polysaccharide-23 07/20/2010, 12/01/2018  . Tdap 04/17/2014  . Varicella 04/17/2014    Past Medical History:  Diagnosis Date  . Anemia   . Asthma   . Hypothyroid   . IBS (irritable bowel syndrome)     Tobacco History: Social History   Tobacco Use   Smoking Status Never Smoker  Smokeless Tobacco Never Used   Counseling given: Not Answered   Outpatient Medications Prior to Visit  Medication Sig Dispense Refill  . albuterol (PROVENTIL) (2.5 MG/3ML) 0.083% nebulizer solution Take 3-6 mLs (2.5-5 mg total) by nebulization every 4 (four) hours as needed for wheezing or shortness of breath. 3 mL 1  . levothyroxine (SYNTHROID, LEVOTHROID) 100 MCG tablet Take 125 mcg by mouth daily before breakfast.    . albuterol (PROAIR HFA) 108 (90 Base) MCG/ACT inhaler INHALE TWO PUFFS BY MOUTH EVERY 4 HOURS AS NEEDED FOR WHEEZING OR FOR SHORTNESS OF BREATH 8 g 1  . montelukast (SINGULAIR) 10 MG tablet Take 1 tablet (10 mg total) by mouth at bedtime. (Patient not taking: Reported on 08/22/2020) 30 tablet 6  . amoxicillin (AMOXIL) 875 MG tablet Take 1 tablet (875 mg total) by mouth 2 (two) times daily. (Patient not taking: Reported on 08/22/2020) 20 tablet 0  . budesonide-formoterol (SYMBICORT) 80-4.5 MCG/ACT inhaler Inhale 2 puffs into the lungs 2 (two) times daily. (Patient not taking: Reported on 08/22/2020) 1 Inhaler 5  . doxycycline (VIBRA-TABS) 100 MG tablet Take 1 tablet (100 mg total) by mouth 2 (two) times daily. (Patient not taking: Reported on 08/22/2020) 14 tablet 0  . ferrous sulfate 325 (65 FE) MG tablet Take 1 tablet (325 mg total) by mouth 2 (two) times daily with a meal. (Patient not taking: Reported on 08/22/2020) 60 tablet 3  . loratadine (CLARITIN) 10 MG tablet Take 10  mg by mouth daily. (Patient not taking: Reported on 08/22/2020)    . mometasone-formoterol (DULERA) 100-5 MCG/ACT AERO Inhale 2 puffs into the lungs 2 (two) times daily. (Patient not taking: Reported on 08/22/2020) 1 Inhaler 0  . mometasone-formoterol (DULERA) 100-5 MCG/ACT AERO Inhale 2 puffs into the lungs 2 (two) times daily for 15 days. 1 Inhaler 0   No facility-administered medications prior to visit.     Review of Systems:   Constitutional:   No  weight loss, night sweats,   Fevers, chills, fatigue, or  lassitude.  HEENT:   No headaches,  Difficulty swallowing,  Tooth/dental problems, or  Sore throat,                No sneezing, itching, ear ache, nasal congestion, post nasal drip,   CV:  No chest pain,  Orthopnea, PND, swelling in lower extremities, anasarca, dizziness, palpitations, syncope.   GI  No heartburn, indigestion, abdominal pain, nausea, vomiting, diarrhea, change in bowel habits, loss of appetite, bloody stools.   Resp:    No chest wall deformity  Skin: no rash or lesions.  GU: no dysuria, change in color of urine, no urgency or frequency.  No flank pain, no hematuria   MS:  No joint pain or swelling.  No decreased Caldwell of motion.  No back pain.    Physical Exam  BP 106/60 (BP Location: Left Arm, Patient Position: Sitting, Cuff Size: Large)   Pulse 92   Temp 98.1 F (36.7 C) (Temporal)   Ht 5\' 4"  (1.626 m)   Wt 279 lb 12.8 oz (126.9 kg)   SpO2 98%   BMI 48.03 kg/m   GEN: A/Ox3; pleasant , NAD, well nourished    HEENT:  [place]/AT,  , NOSE-clear, THROAT-clear, no lesions, no postnasal drip or exudate noted.  Multiple dental caries  NECK:  Supple w/ fair ROM; no JVD; normal carotid impulses w/o bruits; no thyromegaly or nodules palpated; no lymphadenopathy.    RESP  Clear  P & A; w/o, wheezes/ rales/ or rhonchi. no accessory muscle use, no dullness to percussion  CARD:  RRR, no m/r/g, no peripheral edema, pulses intact, no cyanosis or clubbing.  GI:   Soft & nt; nml bowel sounds; no organomegaly or masses detected.   Musco: Warm bil, no deformities or joint swelling noted.   Neuro: alert, no focal deficits noted.    Skin: Warm, no lesions or rashes    Lab Results:  CBC  BMET  BNP No results found for: BNP  ProBNP No results found for: PROBNP  Imaging: No results found.    No flowsheet data found.  Lab Results  Component Value Date   NITRICOXIDE 5 04/10/2016        Assessment & Plan:   Mild persistent  asthma Increased asthma symptoms with cough and wheezing.  Recent upper respiratory infection.  Will restart Symbicort as patient had significant improvement while taking before.  Check PFTs on return.  Plan  Patient Instructions  Restart Symbicort 80 2 puffs Twice daily  .  Use Albuterol Inhaler every 4hr as needed , this is your emergency /rescue inhaler .  Dental care as discussed .  Follow up Dr. Chase Caller in 2- 3 months with PFT  and As needed   Please contact office for sooner follow up if symptoms do not improve or worsen or seek emergency care        Dental caries Multiple dental caries noted on exam.  Encouraged on dental  follow-up.  Patient says she is currently working on this.     Rexene Edison, NP 08/22/2020

## 2020-08-22 NOTE — Addendum Note (Signed)
Addended by: Vanessa Barbara on: 08/22/2020 05:24 PM   Modules accepted: Orders

## 2020-08-22 NOTE — Assessment & Plan Note (Signed)
Increased asthma symptoms with cough and wheezing.  Recent upper respiratory infection.  Will restart Symbicort as patient had significant improvement while taking before.  Check PFTs on return.  Plan  Patient Instructions  Restart Symbicort 80 2 puffs Twice daily  .  Use Albuterol Inhaler every 4hr as needed , this is your emergency /rescue inhaler .  Dental care as discussed .  Follow up Dr. Chase Caller in 2- 3 months with PFT  and As needed   Please contact office for sooner follow up if symptoms do not improve or worsen or seek emergency care

## 2020-12-09 ENCOUNTER — Telehealth: Payer: Self-pay | Admitting: Internal Medicine

## 2020-12-09 NOTE — Telephone Encounter (Signed)
Primary Pulmonologist: Ramaswamy Last office visit and with whom: Jessica Caldwell  What do we see them for (pulmonary problems): Mild persistent asthma, unspecified whether complicated  Last OV assessment/plan: Mild persistent asthma Increased asthma symptoms with cough and wheezing.  Recent upper respiratory infection.  Will restart Symbicort as patient had significant improvement while taking before.  Check PFTs on return.  Plan  Patient Instructions  Restart Symbicort 80 2 puffs Twice daily  .  Use Albuterol Inhaler every 4hr as needed , this is your emergency /rescue inhaler .  Dental care as discussed .  Follow up Dr. Chase Caller in 2- 3 months with PFT  and As needed   Please contact office for sooner follow up if symptoms do not improve or worsen or seek emergency care        Dental caries Multiple dental caries noted on exam.  Encouraged on dental follow-up.  Patient says she is currently working on this.   Was appointment offered to patient (explain)?  Pt has OV with Beth tomorrow    Reason for call: Spoke with mom (per DPR) who states that pt's cough started a few weeks ago with the weather changed and gotten worse of the last 2 days. Mom states cough is productive with white thick sputum and there was a trace amount of blood in sputum yesterday. Mom states pt's cough is causing increased rib pain. Moms states albuterol has not helped and pt is not currently taking Symbicort r/t not being able to afford inhaler. Mom is wondering if there is anything to help with cough until OV tomorrow. Mom states pt can not take liquid cough medicine r/t thyroid issue. Jessica please advise     Allergies  Allergen Reactions  . Augmentin [Amoxicillin-Pot Clavulanate] Hives and Nausea And Vomiting  . Oxycodone-Acetaminophen Nausea And Vomiting  . Codeine Hives and Nausea And Vomiting  . Sulfa Antibiotics Hives and Nausea And Vomiting    Immunization History  Administered Date(s)  Administered  . HPV Quadrivalent 04/17/2014  . Hepatitis A 04/17/2014  . Influenza,inj,Quad PF,6+ Mos 03/01/2015, 04/21/2016  . Influenza,inj,quad, With Preservative 08/23/2014, 03/01/2015, 04/21/2016  . Meningococcal Conjugate 04/17/2014  . Pneumococcal Polysaccharide-23 07/20/2010, 12/01/2018  . Tdap 04/17/2014  . Varicella 04/17/2014

## 2020-12-09 NOTE — Telephone Encounter (Signed)
Patient's mother, Tracie(DPR) is aware of recommendations and voiced her understanding. Nothing further needed at this time.

## 2020-12-09 NOTE — Telephone Encounter (Signed)
Mucinex DM Twice daily  As needed  Cough/congestion  Zyrtec 10mg  At bedtime  As needed    Will discuss and evaluate in more detail at [facility] tomorrow that is already planned   Please contact office for sooner follow up if symptoms do not improve or worsen or seek emergency care

## 2020-12-10 ENCOUNTER — Ambulatory Visit: Payer: Self-pay | Admitting: Primary Care

## 2020-12-10 NOTE — Progress Notes (Deleted)
@Patient  ID: Jessica Caldwell, female    DOB: 11/09/1995, 25 y.o.   MRN: [id number]  No chief complaint on file.   Referring provider: Family, [facility]  HPI: 25 year old female never smoker followed for asthma. Patient of Dr. Chase Caller, last seen by pulmonary NP on 08/22/20.   Previous [facility] pulmonary encounter: 08/22/2020 Follow up : Asthma  Patient presents for a follow-up visit for asthma.  Patient was last seen September 2019. Has noticed more coughing and wheezing which is worse at night. Needs refill of her inhalers, has not used in couple of years.  Denies any increased sinus drainage or postnasal drip.  No discolored mucus.  Does have significant dental issues is planning going to the dentist soon.  Says she has put on a lot of weight in the last couple years she has had 3 kids that are under 5. Denies current pregnancy.  Says she had a cold with a deep cough few weeks ago that has resolved but continues to have some intermittent wheezing and cough is worse at night.  She denies any heartburn or indigestion.  12/10/2020 - Interim hx  Patient presents today for 3 month follow-up.       TEST/EVENTS :  Spirometry 03/2016  shows normal -poor effort. FEV1 88%, ratio 77, FVC 100%.  FeNO - 5 .   Chest xray 11/2019 clear lungs.   Allergies  Allergen Reactions  . Augmentin [Amoxicillin-Pot Clavulanate] Hives and Nausea And Vomiting  . Oxycodone-Acetaminophen Nausea And Vomiting  . Codeine Hives and Nausea And Vomiting  . Sulfa Antibiotics Hives and Nausea And Vomiting    Immunization History  Administered Date(s) Administered  . HPV Quadrivalent 04/17/2014  . Hepatitis A 04/17/2014  . Influenza,inj,Quad PF,6+ Mos 03/01/2015, 04/21/2016  . Influenza,inj,quad, With Preservative 08/23/2014, 03/01/2015, 04/21/2016  . Meningococcal Conjugate 04/17/2014  . Pneumococcal Polysaccharide-23 07/20/2010, 12/01/2018  . Tdap 04/17/2014  . Varicella 04/17/2014    Past Medical History:   Diagnosis Date  . Anemia   . Asthma   . Hypothyroid   . IBS (irritable bowel syndrome)     Tobacco History: Social History   Tobacco Use  Smoking Status Never Smoker  Smokeless Tobacco Never Used   Counseling given: Not Answered   Outpatient Medications Prior to Visit  Medication Sig Dispense Refill  . albuterol (PROAIR HFA) 108 (90 Base) MCG/ACT inhaler INHALE TWO PUFFS BY MOUTH EVERY 4 HOURS AS NEEDED FOR WHEEZING OR FOR SHORTNESS OF BREATH 18 g 3  . albuterol (PROVENTIL) (2.5 MG/3ML) 0.083% nebulizer solution Take 3-6 mLs (2.5-5 mg total) by nebulization every 4 (four) hours as needed for wheezing or shortness of breath. 3 mL 1  . budesonide-formoterol (SYMBICORT) 80-4.5 MCG/ACT inhaler Inhale 2 puffs into the lungs 2 (two) times daily. 1 each 5  . levothyroxine (SYNTHROID, LEVOTHROID) 100 MCG tablet Take 125 mcg by mouth daily before breakfast.    . montelukast (SINGULAIR) 10 MG tablet Take 1 tablet (10 mg total) by mouth at bedtime. (Patient not taking: Reported on 08/22/2020) 30 tablet 6   No facility-administered medications prior to visit.      Review of Systems  Review of Systems   Physical Exam  There were no vitals taken for this visit. Physical Exam   Lab Results:  CBC    Component Value Date/Time   WBC 5.6 04/10/2016 1507   RBC 4.20 04/10/2016 1507   HGB 12.1 04/10/2016 1507   HCT 35.8 (L) 04/10/2016 1507   PLT 230.0  04/10/2016 1507   MCV 85.2 04/10/2016 1507   MCH 26.7 (L) 12/12/2015 1145   MCHC 33.8 04/10/2016 1507   RDW 15.4 04/10/2016 1507   LYMPHSABS 1.1 09/02/2015 1116   MONOABS 0.4 09/02/2015 1116   EOSABS 0.0 09/02/2015 1116   BASOSABS 0.0 09/02/2015 1116    BMET    Component Value Date/Time   NA 141 04/10/2016 1507   K 4.3 04/10/2016 1507   CL 107 04/10/2016 1507   CO2 28 04/10/2016 1507   GLUCOSE 73 04/10/2016 1507   BUN 12 04/10/2016 1507   CREATININE 0.75 04/10/2016 1507   CALCIUM 8.8 04/10/2016 1507   GFRNONAA >60  02/10/2015 2135   GFRAA >60 02/10/2015 2135    BNP No results found for: BNP  ProBNP No results found for: PROBNP  Imaging: No results found.   Assessment & Plan:   No problem-specific Assessment & Plan notes found for this encounter.     Martyn Ehrich, NP 12/10/2020

## 2020-12-11 ENCOUNTER — Encounter: Payer: Self-pay | Admitting: Adult Health

## 2020-12-11 ENCOUNTER — Ambulatory Visit (INDEPENDENT_AMBULATORY_CARE_PROVIDER_SITE_OTHER): Payer: Self-pay | Admitting: Adult Health

## 2020-12-11 ENCOUNTER — Other Ambulatory Visit: Payer: Self-pay

## 2020-12-11 ENCOUNTER — Ambulatory Visit (INDEPENDENT_AMBULATORY_CARE_PROVIDER_SITE_OTHER): Payer: Self-pay

## 2020-12-11 VITALS — BP 112/66 | HR 83 | Temp 97.4°F | Ht 64.0 in | Wt 270.8 lb

## 2020-12-11 DIAGNOSIS — J4530 Mild persistent asthma, uncomplicated: Secondary | ICD-10-CM

## 2020-12-11 MED ORDER — BUDESONIDE-FORMOTEROL FUMARATE 80-4.5 MCG/ACT IN AERO: 2.0000 | INHALATION_SPRAY | Freq: Two times a day (BID) | RESPIRATORY_TRACT | 6 refills | Status: AC

## 2020-12-11 MED ORDER — PREDNISONE 20 MG PO TABS: 20.0000 mg | ORAL_TABLET | Freq: Every day | ORAL | 0 refills | Status: DC

## 2020-12-11 MED ORDER — ALBUTEROL SULFATE HFA 108 (90 BASE) MCG/ACT IN AERS: INHALATION_SPRAY | RESPIRATORY_TRACT | 3 refills | Status: AC

## 2020-12-11 MED ORDER — MONTELUKAST SODIUM 10 MG PO TABS: 10.0000 mg | ORAL_TABLET | Freq: Every day | ORAL | 6 refills | Status: AC

## 2020-12-11 NOTE — Patient Instructions (Addendum)
Prednisone 20mg  daily for 5 days . Take with food.  Chest xray today  Restart Symbicort 80 2 puffs Twice daily  .  Restart Singulair 10mg  daily  Add Zyrtec 10mg  daily .  Mucinex DM .Twice daily  As needed  Cough /congestion  Use Albuterol Inhaler every 4hr as needed , this is your emergency /rescue inhaler .  Dental care as discussed next month as planned  Follow up Dr. Chase Caller in 6-8 weeks with PFT  and As needed   Please contact office for sooner follow up if symptoms do not improve or worsen or seek emergency care

## 2020-12-11 NOTE — Progress Notes (Signed)
@Patient  ID: Jessica Caldwell, female    DOB: 05-Mar-1996, 25 y.o.   MRN: [id number]  Chief Complaint  Patient presents with  . Follow-up    Referring provider: Family, [facility]  HPI: 25 year old female never smoker followed for asthma  TEST/EVENTS :  Spirometry 03/2016  shows normal -poor effort. FEV1 88%, ratio 77, FVC 100%.  FeNO - 5 .   Chest xray 11/2019 clear lungs.   12/11/2020 Follow up : Asthma  Patient presents for a 28-month follow-up.  Patient has underlying asthma.  Last visit she was recommended to restart her Symbicort twice daily. ,  Recommend to follow-up with a dentist for some ongoing dental caries. And return for pulmonary function testing.  Unfortunately her PFTs were not completed. ACT score today is 17 .  Complains for last 2 weeks ago she developed cold symptoms with dry cough, sore throat, postnasal drainage.  Intermittent wheezing.  Patient had taken Symbicort for a short period of time but unfortunately was unable to take this for the last several days due to increased expense.  She has recently changed insurances and should have good coverage next week.  She says that she has increased symptoms if she is exposed to cats humid weather or smoke.  She denies any chest pain hemoptysis orthopnea PND or leg swelling.  Denies pregnancy.  She does have an appointment with a dentist next month. Previous pulmonary function testing and 2018 showed normal lung function.  Allergy profile showed IgE at 30 and negative allergy panel.  Eosinophils were 100    Allergies  Allergen Reactions  . Augmentin [Amoxicillin-Pot Clavulanate] Hives and Nausea And Vomiting  . Oxycodone-Acetaminophen Nausea And Vomiting  . Codeine Hives and Nausea And Vomiting  . Sulfa Antibiotics Hives and Nausea And Vomiting    Immunization History  Administered Date(s) Administered  . HPV Quadrivalent 04/17/2014  . Hepatitis A 04/17/2014  . Influenza,inj,Quad PF,6+ Mos 03/01/2015,  04/21/2016  . Influenza,inj,quad, With Preservative 08/23/2014, 03/01/2015, 04/21/2016  . Meningococcal Conjugate 04/17/2014  . Pneumococcal Polysaccharide-23 07/20/2010, 12/01/2018  . Tdap 04/17/2014  . Varicella 04/17/2014    Past Medical History:  Diagnosis Date  . Anemia   . Asthma   . Hypothyroid   . IBS (irritable bowel syndrome)     Tobacco History: Social History   Tobacco Use  Smoking Status Never Smoker  Smokeless Tobacco Never Used   Counseling given: Not Answered   Outpatient Medications Prior to Visit  Medication Sig Dispense Refill  . albuterol (PROVENTIL) (2.5 MG/3ML) 0.083% nebulizer solution Take 3-6 mLs (2.5-5 mg total) by nebulization every 4 (four) hours as needed for wheezing or shortness of breath. 3 mL 1  . levothyroxine (SYNTHROID, LEVOTHROID) 100 MCG tablet Take 125 mcg by mouth daily before breakfast.    . albuterol (PROAIR HFA) 108 (90 Base) MCG/ACT inhaler INHALE TWO PUFFS BY MOUTH EVERY 4 HOURS AS NEEDED FOR WHEEZING OR FOR SHORTNESS OF BREATH 18 g 3  . montelukast (SINGULAIR) 10 MG tablet Take 1 tablet (10 mg total) by mouth at bedtime. 30 tablet 6  . budesonide-formoterol (SYMBICORT) 80-4.5 MCG/ACT inhaler Inhale 2 puffs into the lungs 2 (two) times daily. (Patient not taking: Reported on 12/11/2020) 1 each 5   No facility-administered medications prior to visit.     Review of Systems:   Constitutional:   No  weight loss, night sweats,  Fevers, chills, fatigue, or  lassitude.  HEENT:   No headaches,  Difficulty swallowing,  Tooth/dental problems,  or  Sore throat,                No sneezing, itching, ear ache,  +nasal congestion, post nasal drip,   CV:  No chest pain,  Orthopnea, PND, swelling in lower extremities, anasarca, dizziness, palpitations, syncope.   GI  No heartburn, indigestion, abdominal pain, nausea, vomiting, diarrhea, change in bowel habits, loss of appetite, bloody stools.   Resp:  .  No chest wall deformity  Skin: no  rash or lesions.  GU: no dysuria, change in color of urine, no urgency or frequency.  No flank pain, no hematuria   MS:  No joint pain or swelling.  No decreased Caldwell of motion.  No back pain.    Physical Exam  BP 112/66 (BP Location: Left Arm, Patient Position: Sitting, Cuff Size: Normal)   Pulse 83   Temp (!) 97.4 F (36.3 C) (Temporal)   Ht 5\' 4"  (1.626 m)   Wt 270 lb 12.8 oz (122.8 kg)   SpO2 98%   BMI 46.48 kg/m   GEN: A/Ox3; pleasant , NAD, well nourished    HEENT:  [place]/AT,    NOSE-clear, THROAT-clear, no lesions, no postnasal drip or exudate noted.poor dentition   NECK:  Supple w/ fair ROM; no JVD; normal carotid impulses w/o bruits; no thyromegaly or nodules palpated; no lymphadenopathy.    RESP  Clear  P & A; w/o, wheezes/ rales/ or rhonchi. no accessory muscle use, no dullness to percussion  CARD:  RRR, no m/r/g, no peripheral edema, pulses intact, no cyanosis or clubbing.  GI:   Soft & nt; nml bowel sounds; no organomegaly or masses detected.   Musco: Warm bil, no deformities or joint swelling noted.   Neuro: alert, no focal deficits noted.    Skin: Warm, no lesions or rashes    Lab Results:  CBC    Component Value Date/Time   WBC 5.6 04/10/2016 1507   RBC 4.20 04/10/2016 1507   HGB 12.1 04/10/2016 1507   HCT 35.8 (L) 04/10/2016 1507   PLT 230.0 04/10/2016 1507   MCV 85.2 04/10/2016 1507   MCH 26.7 (L) 12/12/2015 1145   MCHC 33.8 04/10/2016 1507   RDW 15.4 04/10/2016 1507   LYMPHSABS 1.1 09/02/2015 1116   MONOABS 0.4 09/02/2015 1116   EOSABS 0.0 09/02/2015 1116   BASOSABS 0.0 09/02/2015 1116    BMET    Component Value Date/Time   NA 141 04/10/2016 1507   K 4.3 04/10/2016 1507   CL 107 04/10/2016 1507   CO2 28 04/10/2016 1507   GLUCOSE 73 04/10/2016 1507   BUN 12 04/10/2016 1507   CREATININE 0.75 04/10/2016 1507   CALCIUM 8.8 04/10/2016 1507   GFRNONAA >60 02/10/2015 2135   GFRAA >60 02/10/2015 2135    BNP No results found for:  BNP  ProBNP No results found for: PROBNP  Imaging: No results found.    No flowsheet data found.  Lab Results  Component Value Date   NITRICOXIDE 5 04/10/2016        Assessment & Plan:   Mild persistent asthma Exacerbation Chest xray today .  Patient is encouraged to restart Symbicort.  Also will have her restart  Singulair 10 mg daily.  Add in Zyrtec 10 mg daily.  We will treat with a short course of steroids. Check PFT on return   Plan  Patient Instructions  Prednisone 20mg  daily for 5 days . Take with food.  Chest xray today  Restart Symbicort 80  2 puffs Twice daily  .  Restart Singulair 10mg  daily  Add Zyrtec 10mg  daily .  Mucinex DM .Twice daily  As needed  Cough /congestion  Use Albuterol Inhaler every 4hr as needed , this is your emergency /rescue inhaler .  Dental care as discussed next month as planned  Follow up Dr. Chase Caller in 6-8 weeks with PFT  and As needed   Please contact office for sooner follow up if symptoms do not improve or worsen or seek emergency care           Rexene Edison, NP 12/11/2020

## 2020-12-11 NOTE — Assessment & Plan Note (Signed)
Exacerbation Chest xray today .  Patient is encouraged to restart Symbicort.  Also will have her restart  Singulair 10 mg daily.  Add in Zyrtec 10 mg daily.  We will treat with a short course of steroids. Check PFT on return   Plan  Patient Instructions  Prednisone 20mg  daily for 5 days . Take with food.  Chest xray today  Restart Symbicort 80 2 puffs Twice daily  .  Restart Singulair 10mg  daily  Add Zyrtec 10mg  daily .  Mucinex DM .Twice daily  As needed  Cough /congestion  Use Albuterol Inhaler every 4hr as needed , this is your emergency /rescue inhaler .  Dental care as discussed next month as planned  Follow up Dr. Chase Caller in 6-8 weeks with PFT  and As needed   Please contact office for sooner follow up if symptoms do not improve or worsen or seek emergency care

## 2021-01-21 ENCOUNTER — Other Ambulatory Visit (HOSPITAL_COMMUNITY): Payer: Self-pay

## 2021-01-23 ENCOUNTER — Ambulatory Visit: Payer: Self-pay | Admitting: Adult Health

## 2022-01-07 ENCOUNTER — Ambulatory Visit: Payer: Medicaid Other | Admitting: Nurse Practitioner

## 2022-01-09 ENCOUNTER — Emergency Department (HOSPITAL_BASED_OUTPATIENT_CLINIC_OR_DEPARTMENT_OTHER): Payer: Medicaid Other

## 2022-01-09 ENCOUNTER — Emergency Department (HOSPITAL_BASED_OUTPATIENT_CLINIC_OR_DEPARTMENT_OTHER)
Admission: EM | Admit: 2022-01-09 | Discharge: 2022-01-09 | Disposition: A | Discharge status: Home / Self Care | Payer: Medicaid Other | Attending: Emergency Medicine | Admitting: Emergency Medicine

## 2022-01-09 ENCOUNTER — Other Ambulatory Visit: Payer: Self-pay

## 2022-01-09 ENCOUNTER — Encounter (HOSPITAL_BASED_OUTPATIENT_CLINIC_OR_DEPARTMENT_OTHER): Payer: Self-pay

## 2022-01-09 DIAGNOSIS — E039 Hypothyroidism, unspecified: Secondary | ICD-10-CM | POA: Diagnosis not present

## 2022-01-09 DIAGNOSIS — M25561 Pain in right knee: Secondary | ICD-10-CM | POA: Diagnosis not present

## 2022-01-09 DIAGNOSIS — J45909 Unspecified asthma, uncomplicated: Secondary | ICD-10-CM | POA: Insufficient documentation

## 2022-01-09 MED ORDER — MELOXICAM 15 MG PO TABS: 15.0000 mg | ORAL_TABLET | Freq: Every day | ORAL | 0 refills | Status: AC

## 2022-01-16 ENCOUNTER — Ambulatory Visit: Payer: Medicaid Other | Admitting: Nurse Practitioner

## 2022-01-23 ENCOUNTER — Encounter: Payer: Self-pay | Admitting: Family Medicine

## 2022-01-23 ENCOUNTER — Ambulatory Visit: Payer: Self-pay

## 2022-01-23 ENCOUNTER — Ambulatory Visit (HOSPITAL_BASED_OUTPATIENT_CLINIC_OR_DEPARTMENT_OTHER)
Admission: RE | Admit: 2022-01-23 | Discharge: 2022-01-23 | Disposition: A | Discharge status: Home / Self Care | Payer: Medicaid Other | Source: Ambulatory Visit | Attending: Family Medicine | Admitting: Family Medicine

## 2022-01-23 ENCOUNTER — Ambulatory Visit (INDEPENDENT_AMBULATORY_CARE_PROVIDER_SITE_OTHER): Payer: Medicaid Other | Admitting: Family Medicine

## 2022-01-23 VITALS — BP 120/70 | Ht 64.0 in | Wt 220.0 lb

## 2022-01-23 DIAGNOSIS — M25461 Effusion, right knee: Secondary | ICD-10-CM

## 2022-01-23 DIAGNOSIS — S83004A Unspecified dislocation of right patella, initial encounter: Secondary | ICD-10-CM | POA: Insufficient documentation

## 2022-01-23 DIAGNOSIS — S83004D Unspecified dislocation of right patella, subsequent encounter: Secondary | ICD-10-CM | POA: Insufficient documentation

## 2022-01-23 DIAGNOSIS — M25561 Pain in right knee: Secondary | ICD-10-CM

## 2022-01-23 NOTE — Progress Notes (Signed)
  Jessica Caldwell - 26 y.o. female MRN [id number]  Date of birth: 08-06-1995  SUBJECTIVE:  Including CC & ROS.  No chief complaint on file.   Jessica Caldwell is a 26 y.o. female that is presenting with right wrist pain after an injury sustained at work.  A dog ran into the back of her leg and she subsequently had dislocation of the kneecap.  Having swelling and ongoing pain in the medial compartment.  Review of the emergency department note from 6/23 shows she was provided meloxicam. Independent review of the right knee x-ray from 6/23 shows no acute changes.  Review of Systems See HPI   HISTORY: Past Medical, Surgical, Social, and Family History Reviewed & Updated per EMR.   Pertinent Historical Findings include:  Past Medical History:  Diagnosis Date   Anemia    Asthma    Hypothyroid    IBS (irritable bowel syndrome)     Past Surgical History:  Procedure Laterality Date   KNEE SURGERY     TONSILLECTOMY     VIDEO BRONCHOSCOPY Bilateral 06/29/2013   Procedure: VIDEO BRONCHOSCOPY WITHOUT FLUORO;  Surgeon: Brand Males, MD;  Location: [facility];  Service: Cardiopulmonary;  Laterality: Bilateral;     PHYSICAL EXAM:  VS: BP 120/70 (BP Location: Left Arm, Patient Position: Sitting)   Ht '5\' 4"'$  (1.626 m)   Wt 220 lb (99.8 kg)   LMP 01/07/2022   BMI 37.76 kg/m  Physical Exam Gen: NAD, alert, cooperative with exam, well-appearing MSK:  Neurovascularly intact    Limited ultrasound: Right knee:  Moderate effusion suprapatellar pouch. Normal-appearing quadricep and patellar tendon. Normal-appearing medial meniscus. There is a defect of the medial retinaculum  Summary: Medial retinacular tear  Ultrasound and interpretation by Clearance Coots, MD  Aspiration/Injection Procedure Note Jessica Caldwell 03-16-1996  Procedure: Aspiration Indications: Right leg pain  Procedure Details Consent: Risks of procedure as well as the alternatives and risks of each were explained  to the (patient/caregiver).  Consent for procedure obtained. Time Out: Verified patient identification, verified procedure, site/side was marked, verified correct patient position, special equipment/implants available, medications/allergies/relevent history reviewed, required imaging and test results available.  Performed.  The area was cleaned with iodine and alcohol swabs.    The right knee superior lateral suprapatellar pouch was injected using 3 cc of 1% lidocaine on a 25-gauge 1-1/2 inch needle.  An 18-gauge 1 and 1 officiated was used to achieve aspiration.  Ultrasound was used. Images were obtained in long views showing the injection.    Amount of Fluid Aspirated:  68m Character of Fluid: clear, straw colored, and red colored Fluid was sent for: n/a  A sterile dressing was applied.  Patient did tolerate procedure well.     ASSESSMENT & PLAN:   Knee effusion, right Acutely occurring after an injury at work.  Aspiration today does not suggest underlying structural change after this injury. -Counseled on home exercise therapy and supportive care. -Aspiration. -Could consider further imaging or physical therapy.  Patellar dislocation, right, initial encounter Acutely occurring after an injury at work.  Ultrasound was demonstrating a significant defect within the medial retinaculum. -Counseled on home exercise therapy and supportive care. -Brace today. -Provided work note. -Could consider physical therapy or further imaging.

## 2022-01-23 NOTE — Assessment & Plan Note (Signed)
Acutely occurring after an injury at work.  Ultrasound was demonstrating a significant defect within the medial retinaculum. -Counseled on home exercise therapy and supportive care. -Brace today. -Provided work note. -Could consider physical therapy or further imaging.

## 2022-01-23 NOTE — Patient Instructions (Signed)
Nice to meet you Please try the exercises  Please use ice  Please use the brace  I will call you with the results from today  Please send me a message in MyChart with any questions or updates.  Please see me back in 3 weeks.   --Dr. Raeford Razor

## 2022-01-23 NOTE — Assessment & Plan Note (Signed)
Acutely occurring after an injury at work.  Aspiration today does not suggest underlying structural change after this injury. -Counseled on home exercise therapy and supportive care. -Aspiration. -Could consider further imaging or physical therapy.

## 2022-01-26 ENCOUNTER — Telehealth: Payer: Self-pay | Admitting: Family Medicine

## 2022-01-26 NOTE — Telephone Encounter (Signed)
Informed of results.   Rosemarie Ax, MD [facility] 01/26/2022, 12:37 PM

## 2022-02-16 ENCOUNTER — Ambulatory Visit: Payer: Medicaid Other | Admitting: Family Medicine

## 2022-04-21 ENCOUNTER — Ambulatory Visit: Payer: Medicaid Other | Admitting: Family Medicine

## 2022-04-24 ENCOUNTER — Encounter: Payer: Self-pay | Admitting: Family Medicine

## 2022-04-24 ENCOUNTER — Ambulatory Visit (INDEPENDENT_AMBULATORY_CARE_PROVIDER_SITE_OTHER): Payer: Medicaid Other | Admitting: Family Medicine

## 2022-04-24 VITALS — BP 108/73 | Ht 64.0 in | Wt 220.0 lb

## 2022-04-24 DIAGNOSIS — S83004D Unspecified dislocation of right patella, subsequent encounter: Secondary | ICD-10-CM | POA: Diagnosis not present

## 2022-04-24 DIAGNOSIS — M25461 Effusion, right knee: Secondary | ICD-10-CM

## 2022-04-24 NOTE — Progress Notes (Signed)
  Jessica Caldwell - 26 y.o. female MRN [id number]  Date of birth: 04-14-1996  SUBJECTIVE:  Including CC & ROS.  No chief complaint on file.   Jessica Caldwell is a 26 y.o. female that is presenting with worsening of her right knee pain after an injury at work.  She has had recurrent subluxations with ongoing effusion.  Continues to wear the brace with limited improvement..   Review of Systems See HPI   HISTORY: Past Medical, Surgical, Social, and Family History Reviewed & Updated per EMR.   Pertinent Historical Findings include:  Past Medical History:  Diagnosis Date   Anemia    Asthma    Hypothyroid    IBS (irritable bowel syndrome)     Past Surgical History:  Procedure Laterality Date   KNEE SURGERY     TONSILLECTOMY     VIDEO BRONCHOSCOPY Bilateral 06/29/2013   Procedure: VIDEO BRONCHOSCOPY WITHOUT FLUORO;  Surgeon: Brand Males, MD;  Location: [facility];  Service: Cardiopulmonary;  Laterality: Bilateral;     PHYSICAL EXAM:  VS: BP 108/73 (BP Location: Right Arm, Patient Position: Sitting)   Ht '5\' 4"'$  (1.626 m)   Wt 220 lb (99.8 kg)   BMI 37.76 kg/m  Physical Exam Gen: NAD, alert, cooperative with exam, well-appearing MSK:  Right knee: Moderate effusion. Instability with tilt testing of the knee. Positive J sign. Weakness to resistance with knee flexion extension Neurovascularly intact       ASSESSMENT & PLAN:   Patellar dislocation, right, subsequent encounter Acutely occurring after an injury at work.  Continues to have subluxations after initial injury.  The patella feels unstable with any motion. -Counseled on home exercise therapy and supportive care. -Consent on compression. -MRI of the right knee to evaluate for medial retinacular disruption and presurgical planning.  Knee effusion, right Acutely occurring after an injury at work.  Having ongoing effusion today. -Counseled on home exercise therapy and supportive care. -counseled on  compression.

## 2022-04-24 NOTE — Assessment & Plan Note (Addendum)
Acutely occurring after an injury at work.  Having ongoing effusion today. -Counseled on home exercise therapy and supportive care. -counseled on compression.

## 2022-04-24 NOTE — Patient Instructions (Signed)
Good to see you Please use ice as needed  Please consider compression to help with the fluid  We will send the MRI order to the case manager   Please send me a message in [place] with any questions or updates.  We will schedule a virtual visit once the MRi is resulted.   --Dr. Raeford Razor

## 2022-04-24 NOTE — Assessment & Plan Note (Signed)
Acutely occurring after an injury at work.  Continues to have subluxations after initial injury.  The patella feels unstable with any motion. -Counseled on home exercise therapy and supportive care. -Consent on compression. -MRI of the right knee to evaluate for medial retinacular disruption and presurgical planning.

## 2022-06-30 ENCOUNTER — Ambulatory Visit: Payer: Self-pay

## 2022-06-30 ENCOUNTER — Encounter: Payer: Self-pay | Admitting: Family Medicine

## 2022-06-30 ENCOUNTER — Ambulatory Visit (INDEPENDENT_AMBULATORY_CARE_PROVIDER_SITE_OTHER): Payer: Medicaid Other | Admitting: Family Medicine

## 2022-06-30 VITALS — BP 120/70 | Ht 64.0 in | Wt 221.0 lb

## 2022-06-30 DIAGNOSIS — S83004A Unspecified dislocation of right patella, initial encounter: Secondary | ICD-10-CM

## 2022-06-30 NOTE — Progress Notes (Addendum)
  Jessica Caldwell - 26 y.o. female MRN [id number]  Date of birth: Mar 04, 1996  SUBJECTIVE:  Including CC & ROS.  No chief complaint on file.   Jessica Caldwell is a 26 y.o. female that is presenting with an acute right knee pain while at work.  She had a elbow dislocation when a dog hit the lateral aspect of her knee.  The patella was out of place for a short period of time.  Now she is having significant swelling and pain and bruising around the knee.  Unable to bear weight without pain.    Review of Systems See HPI   HISTORY: Past Medical, Surgical, Social, and Family History Reviewed & Updated per EMR.   Pertinent Historical Findings include:  Past Medical History:  Diagnosis Date   Anemia    Asthma    Hypothyroid    IBS (irritable bowel syndrome)     Past Surgical History:  Procedure Laterality Date   KNEE SURGERY     TONSILLECTOMY     VIDEO BRONCHOSCOPY Bilateral 06/29/2013   Procedure: VIDEO BRONCHOSCOPY WITHOUT FLUORO;  Surgeon: Brand Males, MD;  Location: [facility];  Service: Cardiopulmonary;  Laterality: Bilateral;     PHYSICAL EXAM:  VS: BP 120/70   Ht '5\' 4"'$  (1.626 m)   Wt 221 lb (100.2 kg)   BMI 37.93 kg/m  Physical Exam Gen: NAD, alert, cooperative with exam, well-appearing MSK:  Neurovascularly intact      Aspiration/Injection Procedure Note Jessica Caldwell 01-Oct-1995  Procedure: Injection and aspiration Indications: right knee pain  Procedure Details Consent: Risks of procedure as well as the alternatives and risks of each were explained to the (patient/caregiver).  Consent for procedure obtained. Time Out: Verified patient identification, verified procedure, site/side was marked, verified correct patient position, special equipment/implants available, medications/allergies/relevent history reviewed, required imaging and test results available.  Performed.  The area was cleaned with iodine and alcohol swabs.    The right knee superior lateral  suprapatellar pouch was injected using 1 cc of 1% lidocaine on a 22-gauge 1-1/2 inch needle.  An 18-gauge 1-1/2 inch needle was used to achieve aspiration.  Ultrasound was used. Images were obtained in long views showing the injection.    Amount of Fluid Aspirated:  43m Character of Fluid: bloody Fluid was sent for: n/a A sterile dressing was applied.  Patient did tolerate procedure well.       ASSESSMENT & PLAN:   Patellar dislocation, right, initial encounter Acutely occurring while at work.  Had a significant patellar dislocation.  Hemarthrosis on aspiration today.  Concern for chondral defect with a rupture of the medial retinaculum. -Counseled on home exercise therapy and supportive care. -Aspiration today. -Counseled on crutches. -Provided knee immobilizer. -Provide work note. -X-ray - could consider further imaging or PT

## 2022-06-30 NOTE — Patient Instructions (Signed)
Good to see you Please call us with the worker's comp information when you get it  Please try the crutches over the next two weeks.  Please use ice as needed   Please send me a message in MyChart with any questions or updates.  Please see me back in 3 weeks.   --Dr. Raeford Razor

## 2022-06-30 NOTE — Assessment & Plan Note (Signed)
Acutely occurring while at work.  Had a significant patellar dislocation.  Hemarthrosis on aspiration today.  Concern for chondral defect with a rupture of the medial retinaculum. -Counseled on home exercise therapy and supportive care. -Aspiration today. -Counseled on crutches. -Provided knee immobilizer. -Provide work note. -X-ray - could consider further imaging or PT

## 2022-07-23 ENCOUNTER — Ambulatory Visit: Payer: Medicaid Other | Admitting: Family Medicine

## 2022-10-01 ENCOUNTER — Encounter: Payer: Self-pay | Admitting: Family Medicine

## 2022-10-01 ENCOUNTER — Ambulatory Visit: Payer: Medicaid Other | Admitting: Family Medicine

## 2022-10-01 ENCOUNTER — Ambulatory Visit (HOSPITAL_BASED_OUTPATIENT_CLINIC_OR_DEPARTMENT_OTHER)
Admission: RE | Admit: 2022-10-01 | Discharge: 2022-10-01 | Disposition: A | Discharge status: Home / Self Care | Payer: Medicaid Other | Source: Ambulatory Visit | Attending: Family Medicine | Admitting: Family Medicine

## 2022-10-01 VITALS — BP 110/68 | Ht 64.0 in | Wt 221.0 lb

## 2022-10-01 DIAGNOSIS — M25461 Effusion, right knee: Secondary | ICD-10-CM

## 2022-10-01 DIAGNOSIS — S83004A Unspecified dislocation of right patella, initial encounter: Secondary | ICD-10-CM | POA: Insufficient documentation

## 2022-10-01 DIAGNOSIS — S83004D Unspecified dislocation of right patella, subsequent encounter: Secondary | ICD-10-CM

## 2022-10-01 NOTE — Assessment & Plan Note (Signed)
Acute reoccurring.  She is having significant effusion on exam.  Previous imaging has been unrevealing.  Has had repeated dislocations of the patella.  Has been under greater than 6 weeks of physician directed home exercise therapy.  She has tried leg extensions on a daily basis and sets of 10 with no improvement.  This is a traumatic event. -Counseled on home exercise therapy and supportive care. -MRI of the right knee to evaluate for retinacular tear and for presurgical planning.

## 2022-10-01 NOTE — Patient Instructions (Signed)
Good to see you  Please use ice as needed  Please use the brace  Please get the xrays downstairs today  We'll proceed with the MRi  Please send me a message in MyChart with any questions or updates.  We'll setup a virtual visit once the MRi is resulted.   --Dr. Raeford Razor

## 2022-10-01 NOTE — Assessment & Plan Note (Signed)
Acute on chronic in nature.  She has had repeated dislocations. -Counseled on home exercise therapy and supportive care. -Provided brace. -MRI of the right knee to evaluate for retinacular tear and internal derangement and for presurgical planning.

## 2022-10-01 NOTE — Progress Notes (Signed)
  LOLAH Caldwell - 27 y.o. female MRN [id number]  Date of birth: 06-Apr-1996  SUBJECTIVE:  Including CC & ROS.  No chief complaint on file.   Jessica Caldwell is a 27 y.o. female that is presenting with acute right patellar dislocation.  She was at work and tripped and her kneecap became dislocated once again.  This is the third or fourth instance of the same injury.  She is having swelling and bruising and pain around the medial aspect of the patella.    Review of Systems See HPI   HISTORY: Past Medical, Surgical, Social, and Family History Reviewed & Updated per EMR.   Pertinent Historical Findings include:  Past Medical History:  Diagnosis Date   Anemia    Asthma    Hypothyroid    IBS (irritable bowel syndrome)     Past Surgical History:  Procedure Laterality Date   KNEE SURGERY     TONSILLECTOMY     VIDEO BRONCHOSCOPY Bilateral 06/29/2013   Procedure: VIDEO BRONCHOSCOPY WITHOUT FLUORO;  Surgeon: Brand Males, MD;  Location: [facility];  Service: Cardiopulmonary;  Laterality: Bilateral;     PHYSICAL EXAM:  VS: BP 110/68 (BP Location: Left Arm, Patient Position: Sitting)   Ht 5\' 4"  (1.626 m)   Wt 221 lb (100.2 kg)   BMI 37.93 kg/m  Physical Exam Gen: NAD, alert, cooperative with exam, well-appearing MSK:  Right knee: Effusion appreciated. Instability with valgus and varus stress testing. Patellar instability with stress testing. Positive grind test. Positive lift off test Neurovascularly intact       ASSESSMENT & PLAN:   Patellar dislocation, right, subsequent encounter Acute on chronic in nature.  She has had repeated dislocations. -Counseled on home exercise therapy and supportive care. -Provided brace. -MRI of the right knee to evaluate for retinacular tear and internal derangement and for presurgical planning.  Knee effusion, right Acute reoccurring.  She is having significant effusion on exam.  Previous imaging has been unrevealing.  Has had  repeated dislocations of the patella.  Has been under greater than 6 weeks of physician directed home exercise therapy.  She has tried leg extensions on a daily basis and sets of 10 with no improvement.  This is a traumatic event. -Counseled on home exercise therapy and supportive care. -MRI of the right knee to evaluate for retinacular tear and for presurgical planning.

## 2022-10-02 ENCOUNTER — Telehealth: Payer: Self-pay | Admitting: Family Medicine

## 2022-10-02 NOTE — Telephone Encounter (Signed)
Informed of results.   Rosemarie Ax, MD [facility] 10/02/2022, 12:29 PM

## 2022-11-03 ENCOUNTER — Encounter: Payer: Self-pay | Admitting: *Deleted

## 2022-12-01 ENCOUNTER — Telehealth (HOSPITAL_BASED_OUTPATIENT_CLINIC_OR_DEPARTMENT_OTHER): Payer: Self-pay

## 2023-03-10 ENCOUNTER — Ambulatory Visit (INDEPENDENT_AMBULATORY_CARE_PROVIDER_SITE_OTHER): Payer: Medicaid Other | Admitting: Sports Medicine

## 2023-03-10 ENCOUNTER — Encounter: Payer: Self-pay | Admitting: Sports Medicine

## 2023-03-10 VITALS — BP 110/68 | Ht 64.0 in | Wt 221.0 lb

## 2023-03-10 DIAGNOSIS — S83005A Unspecified dislocation of left patella, initial encounter: Secondary | ICD-10-CM | POA: Diagnosis present

## 2023-03-10 DIAGNOSIS — S83004D Unspecified dislocation of right patella, subsequent encounter: Secondary | ICD-10-CM

## 2023-03-10 NOTE — Progress Notes (Signed)
   Subjective:    Patient ID: Jessica Caldwell, female    DOB: [birth year], 27 y.o.   MRN: [id number]  HPI chief complaint: Bilateral knee pain  Patient is a 27 year old female that presents today complaining of bilateral knee pain.  She has had multiple right patellar dislocations.  A few days ago, while at work, she was struck from behind by a dog which actually resulted in bilateral patellar dislocations.  She was able to self reduce the left knee and the right patella reduced spontaneously.  This is the first patellar dislocation on the left.  She has seen Dr. Jordan Likes in the past and had x-rays.  An MRI was ordered but was never performed.  Her pain is diffuse throughout her knees.  Since she has had multiple dislocations on the right, her pain has improved rather quickly but her swelling persists.    Review of Systems As above    Objective:   Physical Exam  Right knee: 1+ effusion.  Positive patellar apprehension test.  Quad atrophy noted.  Knee is stable to valgus and varus stressing.  Negative anterior drawer, negative posterior drawer.  Left knee: Positive patellar apprehension test.  No effusion.  Mild quad atrophy.  Knee is stable to valgus and varus stressing.  Negative anterior drawer, negative posterior drawer.      Assessment & Plan:   Multiple right patellar dislocations Initial left patellar dislocation  Patient needs to be evaluated by orthopedic surgery.  I will refer her to Dr. Everardo Pacific for further workup and treatment.  She will follow-up with me as needed.  This note was dictated using Dragon naturally speaking software and may contain errors in syntax, spelling, or content which have not been identified prior to signing this note.

## 2023-06-03 ENCOUNTER — Encounter (HOSPITAL_BASED_OUTPATIENT_CLINIC_OR_DEPARTMENT_OTHER): Payer: Self-pay | Admitting: Orthopaedic Surgery

## 2023-06-03 NOTE — H&P (Signed)
PREOPERATIVE H&P  Chief Complaint: right patella dislocation, OA,subluxation patella  HPI: Jessica Caldwell is a 27 y.o. female who is scheduled for, Procedure(s): KNEE RECONSTRUCTION/MPFL TIBIAL TUBERCLE TRANSFER FASCIOTOMY KNEE ARTHROSCOPY WITH LATERAL RELEASE.   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  This is significantly impairing activities of daily living.    Please see clinic note for further details on this patient's care.    She has elected for surgical management.   Past Medical History:  Diagnosis Date   Anemia    Asthma    Hypothyroid    IBS (irritable bowel syndrome)    Past Surgical History:  Procedure Laterality Date   KNEE SURGERY     TONSILLECTOMY     VIDEO BRONCHOSCOPY Bilateral 06/29/2013   Procedure: VIDEO BRONCHOSCOPY WITHOUT FLUORO;  Surgeon: Kalman Shan, MD;  Location: [facility];  Service: Cardiopulmonary;  Laterality: Bilateral;   Social History   Socioeconomic History   Marital status: Single    Spouse name: Not on file   Number of children: Not on file   Years of education: Not on file   Highest education level: Not on file  Occupational History   Not on file  Tobacco Use   Smoking status: Never   Smokeless tobacco: Never  Vaping Use   Vaping status: Never Used  Substance and Sexual Activity   Alcohol use: No   Drug use: No   Sexual activity: Not Currently    Birth control/protection: None  Other Topics Concern   Not on file  Social History Narrative   Not on file   Social Determinants of Health   Financial Resource Strain: Not on file  Food Insecurity: Not on file  Transportation Needs: Not on file  Physical Activity: Not on file  Stress: Not on file  Social Connections: Unknown (04/19/2023)   Received from [facility]    Social Network    Social Network: Not on file   Family History  Problem Relation Age of Onset   Asthma Paternal Grandfather    Heart disease Paternal Grandfather    Cancer Paternal Grandfather        and several uncles.    Asthma Paternal Grandmother    Clotting disorder Paternal Grandmother    Rheum arthritis Paternal Grandmother    Heart disease Maternal Grandfather    Heart disease Paternal Uncle    Allergies  Allergen Reactions   Augmentin [Amoxicillin-Pot Clavulanate] Hives and Nausea And Vomiting   Oxycodone-Acetaminophen Nausea And Vomiting   Codeine Hives and Nausea And Vomiting   Sulfa Antibiotics Hives and Nausea And Vomiting   Hydrocodone Rash   Prior to Admission medications   Medication Sig Start Date End Date Taking? Authorizing Provider  albuterol (PROAIR HFA) 108 (90 Base) MCG/ACT inhaler INHALE TWO PUFFS BY MOUTH EVERY 4 HOURS AS NEEDED FOR WHEEZING OR FOR SHORTNESS OF BREATH 12/11/20   Parrett, Tammy S, NP  albuterol (PROVENTIL) (2.5 MG/3ML) 0.083% nebulizer solution Take 3-6 mLs (2.5-5 mg total) by nebulization every 4 (four) hours as needed for wheezing or shortness of breath. 12/03/19   Maxwell Caul, PA-C  budesonide-formoterol (SYMBICORT) 80-4.5 MCG/ACT inhaler Inhale 2 puffs into the lungs 2 (two) times daily. Patient not taking: Reported on 12/11/2020 08/22/20   Parrett, Virgel Bouquet, NP  budesonide-formoterol (SYMBICORT) 80-4.5 MCG/ACT  inhaler Inhale 2 puffs into the lungs in the morning and at bedtime. 12/11/20   Parrett, Virgel Bouquet, NP  levothyroxine (SYNTHROID, LEVOTHROID) 100 MCG tablet Take 125 mcg by mouth daily before breakfast.    [provider]  montelukast (SINGULAIR) 10 MG tablet Take 1 tablet (10 mg total) by mouth at bedtime. 12/11/20   Parrett, Virgel Bouquet, NP  predniSONE (DELTASONE) 20 MG tablet Take 1 tablet (20 mg total) by mouth daily with breakfast. 12/11/20   Parrett, Virgel Bouquet, NP    ROS: All other systems have been reviewed and were otherwise  negative with the exception of those mentioned in the HPI and as above.  Physical Exam: General: Alert, no acute distress Cardiovascular: No pedal edema Respiratory: No cyanosis, no use of accessory musculature GI: No organomegaly, abdomen is soft and non-tender Skin: No lesions in the area of chief complaint Neurologic: Sensation intact distally Psychiatric: Patient is competent for consent with normal mood and affect Lymphatic: No axillary or cervical lymphadenopathy  MUSCULOSKELETAL:  EXAMINATION: Range of motion of the knee is full.  She has positive J sign.  Q angle appears to be abnormally high.    imaging: MRI demonstrates a Caton-Deschamps ratio of 1.5, TT-TG of 19, Dejour type B trochlea.     Assessment: right patella dislocation, OA,subluxation patella  Plan: Plan for Procedure(s): KNEE RECONSTRUCTION/MPFL TIBIAL TUBERCLE TRANSFER FASCIOTOMY KNEE ARTHROSCOPY WITH LATERAL RELEASE   The risks benefits and alternatives were discussed with the patient including but not limited to the risks of nonoperative treatment, versus surgical intervention including infection, bleeding, nerve injury,  blood clots, cardiopulmonary complications, morbidity, mortality, among others, and they were willing to proceed.   The patient acknowledged the explanation, agreed to proceed with the plan and consent was signed.   Operative Plan: Right medial patellofemoral ligament reconstruction and tibial tubercle osteotomy Discharge Medications: Dilauded (multiple allergies to narcotics- oxy, codeine, hydrocodone), meloxicam, tylenol, zofran DVT Prophylaxis: ASA 81mg  BID x 6 weeks Physical Therapy: outpatient PT Special Discharge needs: TROM/Bledsoe, iceman    Corinna Capra, PA-C  06/03/2023 7:45 AM

## 2023-06-07 ENCOUNTER — Encounter (HOSPITAL_BASED_OUTPATIENT_CLINIC_OR_DEPARTMENT_OTHER): Payer: Self-pay | Admitting: Orthopaedic Surgery

## 2023-06-07 ENCOUNTER — Other Ambulatory Visit: Payer: Self-pay

## 2023-06-10 ENCOUNTER — Ambulatory Visit (HOSPITAL_BASED_OUTPATIENT_CLINIC_OR_DEPARTMENT_OTHER): Admission: RE | Admit: 2023-06-10 | Payer: Medicaid Other | Source: Home / Self Care | Admitting: Orthopaedic Surgery

## 2023-06-10 DIAGNOSIS — Z01818 Encounter for other preprocedural examination: Secondary | ICD-10-CM

## 2023-06-10 HISTORY — DX: Unspecified dislocation of unspecified patella, initial encounter: S83.006A

## 2023-06-10 SURGERY — KNEE RECONSTRUCTION
Anesthesia: General | Site: Knee | Laterality: Right

## 2023-06-10 NOTE — Discharge Instructions (Signed)
Ramond Marrow MD, MPH Alfonse Alpers, PA-C [facility] [address], [address] [phone] (tel)   [phone] (fax)   POST-OPERATIVE INSTRUCTIONS - MPFL RECONSTRUCTION  WOUND CARE You may remove the Operative Dressing on Post-Op Day #3 (72hrs after surgery).   Leave steri strips in place.   If you feel more comfortable with it you can leave all dressings in place till your 1 week follow-up with me.   KEEP THE INCISIONS CLEAN AND DRY. An ACE wrap may be used to control swelling, do not wrap this too tight.  If the initial ACE wrap feels too tight or constricting you may loosen it. There may be a small amount of fluid/bleeding leaking at the surgical site.  This is normal; the knee is filled with fluid during the procedure and can leak for 24-48hrs after surgery. You may change/reinforce the bandage as needed.  Use the Cryocuff, GameReady or Ice as often as possible for the first 3-4 days, then as needed for pain relief. Always keep a towel, ACE wrap or other barrier between the cooling unit and your skin.  You may shower on Post-Op Day #3. Gently pat the area dry.  Do not soak the knee in water.  Do not go swimming in the pool or ocean until 4 weeks after surgery or when otherwise instructed.  BRACE/AMBULATION Your leg will be placed in a brace post-operatively.  You may remove for hygiene only! You will need to wear your brace at all times until we discuss it further.  It should be locked in full extension (0 degrees) if adjustable.   You will be instructed on further bracing after your first visit. Use crutches for comfort but you can put your full weight on the leg as tolerated.  PHYSICAL THERAPY - You will begin physical therapy soon after surgery (unless otherwise specified) - Please call to set up an appointment, if you do not already have one  - Let our office if there are any issues with scheduling your therapy  - We will help set up PT for you since  we do not take your insurance for PT   REGIONAL ANESTHESIA (NERVE BLOCKS) The anesthesia team may have performed a nerve block for you this is a great tool used to minimize pain.   The block may start wearing off overnight (between 8-24 hours postop) When the block wears off, your pain may go from nearly zero to the pain you would have had postop without the block. This is an abrupt transition but nothing dangerous is happening.   This can be a challenging period but utilize your as needed pain medications to try and manage this period. We suggest you use the pain medication the first night prior to going to bed, to ease this transition.  You may take an extra dose of narcotic when this happens if needed  POST-OP MEDICATIONS- Multimodal approach to pain control In general your pain will be controlled with a combination of substances.  Prescriptions unless otherwise discussed are electronically sent to your pharmacy.  This is a carefully made plan we use to minimize narcotic use.     Meloxicam - Anti-inflammatory medication taken on a scheduled basis Acetaminophen - Non-narcotic pain medicine taken on a scheduled basis  Dilauded - This is a strong narcotic, to be used only on an "as needed" basis for SEVERE pain. Aspirin 81mg  - This medicine is used to minimize the risk of blood clots after surgery.  Zofran -  take as needed for nausea   FOLLOW-UP Please call the office to schedule a follow-up appointment for your incision check if you do not already have one, 7-10 days post-operatively. IF YOU HAVE ANY QUESTIONS, PLEASE FEEL FREE TO CALL OUR OFFICE.  HELPFUL INFORMATION  Keep your leg elevated to decrease swelling, which will then in turn decrease your pain. I would elevate the foot of your bed by putting a couple of couch pillows between your mattress and box spring. I would not keep pillow directly under your ankle.  You must wear the brace locked while sleeping and ambulating until  follow-up.   There will be MORE swelling on days 1-3 than there is on the day of surgery.  This also is normal. The swelling will decrease with the anti-inflammatory medication, ice and keeping it elevated. The swelling will make it more difficult to bend your knee. As the swelling goes down your motion will become easier  You may develop swelling and bruising that extends from your knee down to your calf and perhaps even to your foot over the next week. Do not be alarmed. This too is normal, and it is due to gravity  There may be some numbness adjacent to the incision site. This may last for 6-12 months or longer in some patients and is expected.  You may return to sedentary work/school in the next couple of days when you feel up to it. You will need to keep your leg elevated as much as possible   You should wean off your narcotic medicines as soon as you are able.  Most patients will be off narcotics before their first postop appointment.   We suggest you use the pain medication the first night prior to going to bed, in order to ease any pain when the anesthesia wears off. You should avoid taking pain medications on an empty stomach as it will make you nauseous.  Do not drink alcoholic beverages or take illicit drugs when taking pain medications.  It is against the law to drive while taking narcotics. You cannot drive if your Right leg is in brace locked in extension.  Pain medication may make you constipated.  Below are a few solutions to try in this order: Decrease the amount of pain medication if you aren't having pain. Drink lots of decaffeinated fluids. Drink prune juice and/or eat dried prunes  If the first 3 don't work start with additional solutions Take Colace - an over-the-counter stool softener Take Senokot - an over-the-counter laxative Take Miralax - a stronger over-the-counter laxative   For more information including helpful videos and documents visit our website:    [email or web address]

## 2024-06-27 ENCOUNTER — Encounter (HOSPITAL_BASED_OUTPATIENT_CLINIC_OR_DEPARTMENT_OTHER): Payer: Self-pay

## 2024-06-27 ENCOUNTER — Other Ambulatory Visit: Payer: Self-pay

## 2024-06-27 ENCOUNTER — Emergency Department (HOSPITAL_BASED_OUTPATIENT_CLINIC_OR_DEPARTMENT_OTHER)

## 2024-06-27 ENCOUNTER — Emergency Department (HOSPITAL_BASED_OUTPATIENT_CLINIC_OR_DEPARTMENT_OTHER)
Admission: EM | Admit: 2024-06-27 | Discharge: 2024-06-27 | Disposition: A | Discharge status: Home / Self Care | Attending: Emergency Medicine | Admitting: Emergency Medicine

## 2024-06-27 DIAGNOSIS — M25561 Pain in right knee: Secondary | ICD-10-CM

## 2024-06-27 MED ORDER — ACETAMINOPHEN 500 MG PO TABS
1000.0000 mg | ORAL_TABLET | Freq: Once | ORAL | Status: AC
  Administered 2024-06-27: 1000 mg via ORAL
  Filled 2024-06-27: qty 2

## 2024-06-27 NOTE — Progress Notes (Signed)
 Labs completed 06/27/2024.

## 2024-06-27 NOTE — Discharge Instructions (Addendum)
 You were evaluated in the emergency room for knee pain.  Your x-ray did not show any fracture.  You are fitted with a knee brace.  Please wear this whenever ambulating.  At home you may use Tylenol , ibuprofen  and ice.  Please call the number on the sheet to follow-up with the orthopedic doctor.

## 2024-06-27 NOTE — ED Notes (Signed)
 Discharge instructions reviewed with patient. Patient verbalizes understanding, no further questions at this time. Medications and follow up information provided. No acute distress noted at time of departure.

## 2024-06-27 NOTE — ED Provider Notes (Signed)
 [place] [facility] Provider Note   CSN: [id number] Arrival date & time: 06/27/24  1722     Patient presents with: Knee Injury   Jessica Caldwell is a 28 y.o. female with history of patellar dislocation presents with complaints of knee pain.  Patient states that she was at work with some larger dogs hit her in the back of the knee causing her kneecap to dislocate laterally.  She reduced this manually herself.  She describes pain with ambulation.  Took 2 ibuprofen  without significant improvement.   HPI    Past Medical History:  Diagnosis Date   Anemia    Asthma    Hypothyroid    IBS (irritable bowel syndrome)    Patellar dislocation    Past Surgical History:  Procedure Laterality Date   KNEE SURGERY     TONSILLECTOMY     VIDEO BRONCHOSCOPY Bilateral 06/29/2013   Procedure: VIDEO BRONCHOSCOPY WITHOUT FLUORO;  Surgeon: Dorethia Cave, MD;  Location: [facility];  Service: Cardiopulmonary;  Laterality: Bilateral;     Prior to Admission medications   Medication Sig Start Date End Date Taking? Authorizing Provider  albuterol  (PROAIR  HFA) 108 (90 Base) MCG/ACT inhaler INHALE TWO PUFFS BY MOUTH EVERY 4 HOURS AS NEEDED FOR WHEEZING OR FOR SHORTNESS OF BREATH 12/11/20   Parrett, Tammy S, NP  albuterol  (PROVENTIL ) (2.5 MG/3ML) 0.083% nebulizer solution Take 3-6 mLs (2.5-5 mg total) by nebulization every 4 (four) hours as needed for wheezing or shortness of breath. 12/03/19   Layden, Lindsey A, PA-C  budesonide -formoterol  (SYMBICORT ) 80-4.5 MCG/ACT inhaler Inhale 2 puffs into the lungs 2 (two) times daily. 08/22/20   Parrett, Madelin RAMAN, NP  budesonide -formoterol  (SYMBICORT ) 80-4.5 MCG/ACT inhaler Inhale 2 puffs into the lungs in the morning and at bedtime. 12/11/20   Parrett, Madelin RAMAN, NP  levothyroxine  (SYNTHROID , LEVOTHROID) 100 MCG tablet Take 125 mcg by mouth daily before breakfast.    [provider]  montelukast  (SINGULAIR ) 10 MG tablet Take  1 tablet (10 mg total) by mouth at bedtime. 12/11/20   Parrett, Madelin RAMAN, NP    Allergies: Augmentin [amoxicillin -pot clavulanate], Oxycodone-acetaminophen , Penicillins, Codeine, Sulfa antibiotics, and Hydrocodone    Review of Systems  Musculoskeletal:  Positive for myalgias.    Updated Vital Signs BP 120/79 (BP Location: Right Arm)   Pulse 82   Temp 98.1 F (36.7 C) (Oral)   Ht 5' 4 (1.626 m)   Wt 88.9 kg   LMP 06/13/2024 (Approximate)   SpO2 93%   BMI 33.64 kg/m   Physical Exam Vitals and nursing note reviewed.  Constitutional:      General: She is not in acute distress.    Appearance: She is well-developed.  HENT:     Head: Normocephalic and atraumatic.  Eyes:     Conjunctiva/sclera: Conjunctivae normal.  Cardiovascular:     Rate and Rhythm: Normal rate.     Pulses: Normal pulses.     Heart sounds: No murmur heard. Pulmonary:     Effort: Pulmonary effort is normal. No respiratory distress.  Musculoskeletal:        General: Swelling present.     Cervical back: Neck supple.     Comments: Generalized swelling to right knee, tolerates full range of motion, patella appears to be tracking well, tenderness to medial joint line, appears to have some valgus laxity, DP/PT pulses 2+, compartments soft  Skin:    General: Skin is warm and dry.     Capillary Refill: Capillary  refill takes less than 2 seconds.  Neurological:     Mental Status: She is alert.  Psychiatric:        Mood and Affect: Mood normal.     (all labs ordered are listed, but only abnormal results are displayed) Labs Reviewed - No data to display  EKG: None  Radiology: DG Knee Complete 4 Views Right Result Date: 06/27/2024 CLINICAL DATA:  Patellar dislocation. EXAM: RIGHT KNEE - COMPLETE 4+ VIEW COMPARISON:  Radiograph dated 10/01/2022. FINDINGS: No acute fracture or dislocation. The bones are well mineralized. No arthritic changes. Small suprapatellar effusion. The soft tissues are unremarkable.  IMPRESSION: 1. No acute fracture or dislocation. 2. Small suprapatellar effusion. Electronically Signed   By: Vanetta Chou M.D.   On: 06/27/2024 18:12     Procedures   Medications Ordered in the ED  acetaminophen  (TYLENOL ) tablet 1,000 mg (1,000 mg Oral Given 06/27/24 1834)    Clinical Course as of 06/27/24 1858  Tue Jun 27, 2024  1818 Patient with history of patella dislocation and evaluated for recurrent dislocation with persistent knee pain status post self reduction.  Upon arrival patient is hemodynamically stable.  On exam she has generalized swelling to the knee.  She tolerates full range of motion without significant discomfort.  She does have tenderness to the medial joint line. [JT]    Clinical Course User Index [JT] Donnajean Lynwood DEL, PA-C                                 Medical Decision Making  This patient presents to the ED with chief complaint(s) of knee pain.  The complaint involves an extensive differential diagnosis and also carries with it a high risk of complications and morbidity.   Pertinent past medical history as listed in HPI  The differential diagnosis includes  Fracture, dislocation, sprain Additional history obtained: Records reviewed Care Everywhere/External Records  Disposition:   Patient will be discharged home. The patient has been appropriately medically screened and/or stabilized in the ED. I have low suspicion for any other emergent medical condition which would require further screening, evaluation or treatment in the ED or require inpatient management. At time of discharge the patient is hemodynamically stable and in no acute distress. I have discussed work-up results and diagnosis with patient and answered all questions. Patient is agreeable with discharge plan. We discussed strict return precautions for returning to the emergency department and they verbalized understanding.     Social Determinants of Health:   none  This note was dictated  with voice recognition software.  Despite best efforts at proofreading, errors may have occurred which can change the documentation meaning.       Final diagnoses:  Acute pain of right knee    ED Discharge Orders     None          Donnajean Lynwood DEL DEVONNA 06/27/24 JACKEY Freddi Hamilton, MD 06/27/24 2110

## 2024-06-27 NOTE — ED Triage Notes (Signed)
 States was at work when a dog hit into her right knee causing her knee to dislocate laterally. Had to manually put it back into place. States has hx of same, however usually will go back on her own. C/o medial knee pain. Ambulatory with limp to room.
